# Patient Record
Sex: Male | Born: 1980 | Race: White | Hispanic: No | Marital: Married | Smoking: Former smoker
Health system: Southern US, Community
[De-identification: ages and names within clinical notes are randomized; demographics above are authoritative.]

## PROBLEM LIST (undated history)

## (undated) HISTORY — PX: OTHER SURGICAL HISTORY: SHX169

## (undated) HISTORY — PX: TONSILLECTOMY: SUR1361

---
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
## 2010-09-26 ENCOUNTER — Observation Stay (HOSPITAL_COMMUNITY): Admission: EM | Admit: 2010-09-26 | Discharge: 2010-09-27 | Payer: Self-pay | Admitting: Emergency Medicine

## 2011-01-12 LAB — CBC
HCT: 53.8 % — ABNORMAL HIGH (ref 39.0–52.0)
Hemoglobin: 18.4 g/dL — ABNORMAL HIGH (ref 13.0–17.0)
MCH: 29.1 pg (ref 26.0–34.0)
MCHC: 34.2 g/dL (ref 30.0–36.0)
MCV: 85 fL (ref 78.0–100.0)
Platelets: 297 10*3/uL (ref 150–400)
RBC: 6.33 MIL/uL — ABNORMAL HIGH (ref 4.22–5.81)
RDW: 12.8 % (ref 11.5–15.5)
WBC: 9.2 10*3/uL (ref 4.0–10.5)

## 2011-01-12 LAB — COMPREHENSIVE METABOLIC PANEL
ALT: 30 U/L (ref 0–53)
AST: 20 U/L (ref 0–37)
Albumin: 4.2 g/dL (ref 3.5–5.2)
Alkaline Phosphatase: 66 U/L (ref 39–117)
BUN: 14 mg/dL (ref 6–23)
CO2: 26 mEq/L (ref 19–32)
Calcium: 9.8 mg/dL (ref 8.4–10.5)
Chloride: 104 mEq/L (ref 96–112)
Creatinine, Ser: 1.13 mg/dL (ref 0.4–1.5)
GFR calc Af Amer: >60 mL/min (ref >60)
GFR calc non Af Amer: >60 mL/min (ref >60)
Glucose, Bld: 125 mg/dL — ABNORMAL HIGH (ref 70–99)
Potassium: 4.9 mEq/L (ref 3.5–5.1)
Sodium: 137 mEq/L (ref 135–145)
Total Bilirubin: 0.8 mg/dL (ref 0.3–1.2)
Total Protein: 8.3 g/dL (ref 6.0–8.3)

## 2011-01-12 LAB — GIARDIA/CRYPTOSPORIDIUM SCREEN(EIA)
Cryptosporidium Screen (EIA): NEGATIVE
Giardia Screen - EIA: NEGATIVE

## 2011-01-12 LAB — URINALYSIS, ROUTINE W REFLEX MICROSCOPIC
Hgb urine dipstick: NEGATIVE
Ketones, ur: 15 mg/dL — AB
Protein, ur: >300 mg/dL — AB
Urobilinogen, UA: 0.2 mg/dL (ref 0.0–1.0)

## 2011-01-12 LAB — STOOL CULTURE

## 2011-01-12 LAB — DIFFERENTIAL
Basophils Absolute: 0 10*3/uL (ref 0.0–0.1)
Basophils Relative: 0 % (ref 0–1)
Eosinophils Absolute: 0.1 10*3/uL (ref 0.0–0.7)
Eosinophils Relative: 1 % (ref 0–5)
Lymphocytes Relative: 14 % (ref 12–46)
Lymphs Abs: 1.3 10*3/uL (ref 0.7–4.0)
Monocytes Absolute: 1.2 10*3/uL — ABNORMAL HIGH (ref 0.1–1.0)
Monocytes Relative: 13 % — ABNORMAL HIGH (ref 3–12)
Neutro Abs: 6.6 10*3/uL (ref 1.7–7.7)
Neutrophils Relative %: 72 % (ref 43–77)

## 2011-10-03 ENCOUNTER — Emergency Department (HOSPITAL_COMMUNITY): Payer: Worker's Compensation

## 2011-10-03 ENCOUNTER — Emergency Department (HOSPITAL_COMMUNITY)
Admission: EM | Admit: 2011-10-03 | Discharge: 2011-10-03 | Disposition: A | Discharge status: Home / Self Care | Payer: Worker's Compensation | Attending: Emergency Medicine | Admitting: Emergency Medicine

## 2011-10-03 ENCOUNTER — Encounter: Payer: Self-pay | Admitting: *Deleted

## 2011-10-03 DIAGNOSIS — J705 Respiratory conditions due to smoke inhalation: Secondary | ICD-10-CM | POA: Insufficient documentation

## 2011-10-03 DIAGNOSIS — Z87891 Personal history of nicotine dependence: Secondary | ICD-10-CM | POA: Insufficient documentation

## 2011-10-03 DIAGNOSIS — Y9269 Other specified industrial and construction area as the place of occurrence of the external cause: Secondary | ICD-10-CM | POA: Insufficient documentation

## 2011-10-03 DIAGNOSIS — J680 Bronchitis and pneumonitis due to chemicals, gases, fumes and vapors: Secondary | ICD-10-CM | POA: Insufficient documentation

## 2011-10-03 DIAGNOSIS — X001XXA Exposure to smoke in uncontrolled fire in building or structure, initial encounter: Secondary | ICD-10-CM | POA: Insufficient documentation

## 2011-10-03 MED ORDER — ALBUTEROL SULFATE HFA 108 (90 BASE) MCG/ACT IN AERS
2.0000 | INHALATION_SPRAY | Freq: Once | RESPIRATORY_TRACT | Status: AC
  Administered 2011-10-03: 2 via RESPIRATORY_TRACT
  Filled 2011-10-03: qty 6.7

## 2011-10-03 MED ORDER — PREDNISONE 20 MG PO TABS
60.0000 mg | ORAL_TABLET | Freq: Once | ORAL | Status: AC
  Administered 2011-10-03: 60 mg via ORAL
  Filled 2011-10-03: qty 3

## 2011-10-03 MED ORDER — PREDNISONE 50 MG PO TABS: ORAL_TABLET | ORAL | Status: AC

## 2011-10-03 NOTE — ED Notes (Signed)
Patient transported to X-ray 

## 2011-10-03 NOTE — ED Provider Notes (Signed)
Evaluation and management procedures were performed by the mid-level provider (PA/NP/CNM) under my supervision/collaboration. I was present and available during the ED course. Sudeep Scheibel Y.   Gavin Pound. Oletta Lamas, MD 10/03/11 2321
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
## 2014-08-29 ENCOUNTER — Encounter (HOSPITAL_COMMUNITY): Payer: Self-pay | Admitting: Emergency Medicine

## 2014-08-29 ENCOUNTER — Emergency Department (HOSPITAL_COMMUNITY)
Admission: EM | Admit: 2014-08-29 | Discharge: 2014-08-30 | Disposition: A | Discharge status: Home / Self Care | Payer: Self-pay | Attending: Emergency Medicine | Admitting: Emergency Medicine

## 2014-08-29 DIAGNOSIS — W132XXA Fall from, out of or through roof, initial encounter: Secondary | ICD-10-CM

## 2014-08-29 DIAGNOSIS — Y9389 Activity, other specified: Secondary | ICD-10-CM | POA: Insufficient documentation

## 2014-08-29 DIAGNOSIS — S93402A Sprain of unspecified ligament of left ankle, initial encounter: Secondary | ICD-10-CM | POA: Insufficient documentation

## 2014-08-29 DIAGNOSIS — Y929 Unspecified place or not applicable: Secondary | ICD-10-CM | POA: Insufficient documentation

## 2014-08-29 DIAGNOSIS — S30811A Abrasion of abdominal wall, initial encounter: Secondary | ICD-10-CM | POA: Insufficient documentation

## 2014-08-29 DIAGNOSIS — N50812 Left testicular pain: Secondary | ICD-10-CM

## 2014-08-29 DIAGNOSIS — S90512A Abrasion, left ankle, initial encounter: Secondary | ICD-10-CM | POA: Insufficient documentation

## 2014-08-29 DIAGNOSIS — S90511A Abrasion, right ankle, initial encounter: Secondary | ICD-10-CM | POA: Insufficient documentation

## 2014-08-29 DIAGNOSIS — S3022XA Contusion of scrotum and testes, initial encounter: Secondary | ICD-10-CM | POA: Insufficient documentation

## 2014-08-29 DIAGNOSIS — Z720 Tobacco use: Secondary | ICD-10-CM | POA: Insufficient documentation

## 2014-08-29 DIAGNOSIS — W1789XA Other fall from one level to another, initial encounter: Secondary | ICD-10-CM | POA: Insufficient documentation

## 2014-08-29 DIAGNOSIS — T07XXXA Unspecified multiple injuries, initial encounter: Secondary | ICD-10-CM

## 2014-08-29 MED ORDER — FENTANYL CITRATE 0.05 MG/ML IJ SOLN
100.0000 ug | Freq: Once | INTRAMUSCULAR | Status: AC
Start: 2014-08-30 — End: 2014-08-29
  Administered 2014-08-29: 100 ug via INTRAVENOUS
  Filled 2014-08-29: qty 2

## 2014-08-29 NOTE — ED Notes (Signed)
Pt states he fell 16' from roof/ pt complaining of left ankle, both knees & left groin pain. Pt denies any neck pain, pt alert & oriented x4

## 2014-08-29 NOTE — ED Provider Notes (Signed)
CSN: [id number]     Arrival date & time 08/29/14  2112 History  This chart was scribed for Evan SeamenJohn L Nimai Burbach, MD by Evan Patton, ED Scribe. This patient was seen in room APA14/APA14 and the patient's care was started at 11:38 PM.     Chief Complaint  Patient presents with  . Fall   HPI HPI Comments: Evan Patton is a 33 y.o. male who presents to the Emergency Department complaining of constant bilateral ankle, bilateral knee, and groin pain after falling off a 16 ft roof 6 hours ago. Pt states pain is worst in groin and left ankle. Pt states that he landed on his feet after the fall. His bilateral knees and ankles began to hurt and the left side of groin swelled immediately. Pt states difficulty sitting due to pressure in groin. Pt denies neck and back pain as associated symptoms.  History reviewed. No pertinent past medical history. Past Surgical History  Procedure Laterality Date  . 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 No pertinent family history. History  Substance Use Topics  . Smoking status: Former Games developermoker  . Smokeless tobacco: Not on file  . Alcohol Use: No    Review of Systems  10 Systems reviewed and all are negative for acute change except as noted in the HPI.   Allergies  Review of patient's allergies indicates no known allergies.  Home Medications   Prior to Admission medications   Not on File   BP 140/65  Pulse 76  Temp(Src) 98.9 F (37.2 C) (Oral)  Resp 20  Ht 6\' 2"  (1.88 m)  Wt 350 lb (158.759 kg)  BMI 44.92 kg/m2  SpO2 99% Physical Exam  General: Well-developed, well-nourished male in no acute distress; appearance consistent with age of record HENT: normocephalic; atraumatic Eyes: pupils equal, round and reactive to light; extraocular muscles intact Neck: supple; no C-spine tenderness Heart: regular rate and rhythm; no murmurs, rubs or gallops Lungs: clear to auscultation bilaterally Abdomen: soft; nondistended; nontender; no masses  or hepatosplenomegaly; bowel sounds present; superficial abrasions Back: no spinal tenderness GU: Tanner 5 male, uncircumcised. Left testicular tenderness; groin pain with straight leg raise at about 45 degrees; ecchymosis to left hemi-scrotum Extremities: No deformity; full range of motion; pulses normal; mild swelling and tenderness over left ankle; tenderness over malleolus; superficial abrasions to lower extremities Neurologic: Awake, alert and oriented; motor function intact in all extremities and symmetric; no facial droop Skin: Warm and dry Psychiatric: Normal mood and affect   ED Course  Procedures (including critical care time)  DIAGNOSTIC STUDIES: Oxygen Saturation is 99% on RA, normal by my interpretation.    COORDINATION OF CARE: 11:45 PM Discussed treatment plan with pt at bedside and pt agreed to plan.  MDM  Nursing notes and vitals signs, including pulse oximetry, reviewed.  Summary of this visit's results, reviewed by myself:  Labs:  No results found for this or any previous visit (from the past 24 hour(s)).  Imaging Studies: Dg Hip Complete Left  08/30/2014   CLINICAL DATA:  Larey SeatFell off roof today, ankle and hip pain.  EXAM: LEFT HIP - COMPLETE 2+ VIEW  COMPARISON:  None.  FINDINGS: Femoral heads are well formed and located. Crescentic corticated lucency through posterior acetabulum bilaterally. Hip joint spaces are intact. Sacroiliac joints are symmetric.  No destructive bony lesions. Included soft tissue planes are non-suspicious.  IMPRESSION: Bilateral os acetabulum without convincing evidence of acute fracture deformity or dislocation.  Electronically Signed   By: Evan Metroourtnay  Patton   On: 08/30/2014 01:06   Dg Ankle Complete Left  08/30/2014   CLINICAL DATA:  Larey SeatFell off roof today, ankle and hip pain.  EXAM: LEFT ANKLE COMPLETE - 3+ VIEW  COMPARISON:  None.  FINDINGS: No fracture deformity nor dislocation. The ankle mortise appears congruent and the tibiofibular  syndesmosis intact. No destructive bony lesions. Soft tissue planes are non-suspicious.  IMPRESSION: Negative.   Electronically Signed   By: Evan Metroourtnay  Patton   On: 08/30/2014 01:04   [place] Scrotum  08/30/2014   CLINICAL DATA:  Left testicle pain after falling from a roof on 08/29/2014.  EXAM: SCROTAL ULTRASOUND  DOPPLER ULTRASOUND OF THE TESTICLES  TECHNIQUE: Complete ultrasound examination of the testicles, epididymis, and other scrotal structures was performed. Color and spectral Doppler ultrasound were also utilized to evaluate blood flow to the testicles.  COMPARISON:  None.  FINDINGS: Right testicle  Measurements: 5.3 x 2.7 x 3.8 cm. No mass or microlithiasis visualized.  Left testicle  Measurements: 4.2 x 2.2 x 4.3 cm. No mass or microlithiasis visualized.  Right epididymis:  Normal in size and appearance.  Left epididymis:  Normal in size and appearance.  Hydrocele:  Small right hydrocele.  Varicocele:  Left varicocele.  Pulsed Doppler interrogation of both testes demonstrates low resistance arterial and venous waveforms bilaterally.  IMPRESSION: No acute abnormalities.  Left varicocele.  Small right hydrocele.   Electronically Signed   By: Geanie CooleyJim  Patton M.D.   On: 08/30/2014 01:36   [place] Art/ven Flow Abd Pelv Doppler  08/30/2014   CLINICAL DATA:  Left testicle pain after falling from a roof on 08/29/2014.  EXAM: SCROTAL ULTRASOUND  DOPPLER ULTRASOUND OF THE TESTICLES  TECHNIQUE: Complete ultrasound examination of the testicles, epididymis, and other scrotal structures was performed. Color and spectral Doppler ultrasound were also utilized to evaluate blood flow to the testicles.  COMPARISON:  None.  FINDINGS: Right testicle  Measurements: 5.3 x 2.7 x 3.8 cm. No mass or microlithiasis visualized.  Left testicle  Measurements: 4.2 x 2.2 x 4.3 cm. No mass or microlithiasis visualized.  Right epididymis:  Normal in size and appearance.  Left epididymis:  Normal in size and appearance.  Hydrocele:  Small right  hydrocele.  Varicocele:  Left varicocele.  Pulsed Doppler interrogation of both testes demonstrates low resistance arterial and venous waveforms bilaterally.  IMPRESSION: No acute abnormalities.  Left varicocele.  Small right hydrocele.   Electronically Signed   By: Geanie CooleyJim  Patton M.D.   On: 08/30/2014 01:36   1:44 AM Patient advised of findings. Will refer to urology for further evaluation.  I personally performed the services described in this documentation, which was scribed in my presence. The recorded information has been reviewed and is accurate.   Evan SeamenJohn L Ahnesty Finfrock, MD 08/30/14 [phone]

## 2014-08-29 NOTE — ED Notes (Signed)
Patient states he fell off of a roof at 1800. Patient states he slid off on his stomach, and landed on his feet. Patient states abrasion to abdomin, pain to left ankle, groin pain and testicular pain to the left. Bilateral knee pain. Patient denies back/neck pain A&OX4.

## 2014-08-30 ENCOUNTER — Emergency Department (HOSPITAL_COMMUNITY): Payer: Self-pay

## 2014-08-30 MED ORDER — FENTANYL CITRATE 0.05 MG/ML IJ SOLN
100.0000 ug | Freq: Once | INTRAMUSCULAR | Status: AC
  Administered 2014-08-30: 100 ug via INTRAVENOUS
  Filled 2014-08-30: qty 2

## 2014-08-30 MED ORDER — HYDROCODONE-ACETAMINOPHEN 5-325 MG PO TABS: 1.0000 | ORAL_TABLET | Freq: Four times a day (QID) | ORAL | Status: DC | PRN

## 2014-08-30 NOTE — ED Notes (Signed)
No Bariatric crutches available.

## 2014-08-30 NOTE — ED Notes (Signed)
Pt alert & oriented x4. Patient given discharge instructions, paperwork & prescription(s). Patient verbalized understanding. Pt left department by wheelchair w/ no further questions. 

## 2014-08-31 ENCOUNTER — Other Ambulatory Visit (HOSPITAL_COMMUNITY): Payer: Worker's Compensation

## 2014-09-02 ENCOUNTER — Emergency Department (HOSPITAL_COMMUNITY): Payer: Self-pay

## 2014-09-02 ENCOUNTER — Encounter (HOSPITAL_COMMUNITY): Payer: Self-pay | Admitting: *Deleted

## 2014-09-02 ENCOUNTER — Emergency Department (HOSPITAL_COMMUNITY)
Admission: EM | Admit: 2014-09-02 | Discharge: 2014-09-03 | Disposition: A | Discharge status: Home / Self Care | Payer: Self-pay | Attending: Emergency Medicine | Admitting: Emergency Medicine

## 2014-09-02 DIAGNOSIS — R300 Dysuria: Secondary | ICD-10-CM | POA: Insufficient documentation

## 2014-09-02 DIAGNOSIS — W132XXA Fall from, out of or through roof, initial encounter: Secondary | ICD-10-CM | POA: Insufficient documentation

## 2014-09-02 DIAGNOSIS — S300XXD Contusion of lower back and pelvis, subsequent encounter: Secondary | ICD-10-CM

## 2014-09-02 DIAGNOSIS — Y9289 Other specified places as the place of occurrence of the external cause: Secondary | ICD-10-CM | POA: Insufficient documentation

## 2014-09-02 DIAGNOSIS — S300XXA Contusion of lower back and pelvis, initial encounter: Secondary | ICD-10-CM | POA: Insufficient documentation

## 2014-09-02 DIAGNOSIS — Z87891 Personal history of nicotine dependence: Secondary | ICD-10-CM | POA: Insufficient documentation

## 2014-09-02 DIAGNOSIS — S3022XA Contusion of scrotum and testes, initial encounter: Secondary | ICD-10-CM | POA: Insufficient documentation

## 2014-09-02 DIAGNOSIS — W19XXXA Unspecified fall, initial encounter: Secondary | ICD-10-CM

## 2014-09-02 DIAGNOSIS — Y9389 Activity, other specified: Secondary | ICD-10-CM | POA: Insufficient documentation

## 2014-09-02 LAB — COMPREHENSIVE METABOLIC PANEL
ALBUMIN: 3.5 g/dL (ref 3.5–5.2)
ALK PHOS: 58 U/L (ref 39–117)
ALT: 49 U/L (ref 0–53)
ANION GAP: 8 (ref 5–15)
AST: 33 U/L (ref 0–37)
BUN: 8 mg/dL (ref 6–23)
CO2: 29 mEq/L (ref 19–32)
Calcium: 8.8 mg/dL (ref 8.4–10.5)
Chloride: 105 mEq/L (ref 96–112)
Creatinine, Ser: 0.8 mg/dL (ref 0.50–1.35)
GFR calc Af Amer: >90 mL/min (ref >90)
GFR calc non Af Amer: >90 mL/min (ref >90)
Glucose, Bld: 92 mg/dL (ref 70–99)
POTASSIUM: 4.1 meq/L (ref 3.7–5.3)
SODIUM: 142 meq/L (ref 137–147)
TOTAL PROTEIN: 6.7 g/dL (ref 6.0–8.3)
Total Bilirubin: 0.4 mg/dL (ref 0.3–1.2)

## 2014-09-02 LAB — URINALYSIS, ROUTINE W REFLEX MICROSCOPIC
BILIRUBIN URINE: NEGATIVE
Glucose, UA: NEGATIVE mg/dL
Hgb urine dipstick: NEGATIVE
Ketones, ur: NEGATIVE mg/dL
LEUKOCYTES UA: NEGATIVE
NITRITE: NEGATIVE
PH: 6.5 (ref 5.0–8.0)
Protein, ur: NEGATIVE mg/dL
SPECIFIC GRAVITY, URINE: 1.01 (ref 1.005–1.030)
Urobilinogen, UA: 0.2 mg/dL (ref 0.0–1.0)

## 2014-09-02 LAB — CBC WITH DIFFERENTIAL/PLATELET
BASOS ABS: 0 10*3/uL (ref 0.0–0.1)
BASOS PCT: 1 % (ref 0–1)
EOS ABS: 0.2 10*3/uL (ref 0.0–0.7)
Eosinophils Relative: 4 % (ref 0–5)
HCT: 38.1 % — ABNORMAL LOW (ref 39.0–52.0)
Hemoglobin: 13.1 g/dL (ref 13.0–17.0)
Lymphocytes Relative: 26 % (ref 12–46)
Lymphs Abs: 1.3 10*3/uL (ref 0.7–4.0)
MCH: 28.9 pg (ref 26.0–34.0)
MCHC: 34.4 g/dL (ref 30.0–36.0)
MCV: 83.9 fL (ref 78.0–100.0)
MONOS PCT: 10 % (ref 3–12)
Monocytes Absolute: 0.5 10*3/uL (ref 0.1–1.0)
NEUTROS PCT: 59 % (ref 43–77)
Neutro Abs: 2.9 10*3/uL (ref 1.7–7.7)
PLATELETS: 195 10*3/uL (ref 150–400)
RBC: 4.54 MIL/uL (ref 4.22–5.81)
RDW: 12.8 % (ref 11.5–15.5)
WBC: 4.8 10*3/uL (ref 4.0–10.5)

## 2014-09-02 MED ORDER — ONDANSETRON HCL 4 MG/2ML IJ SOLN
4.0000 mg | Freq: Once | INTRAMUSCULAR | Status: AC
  Administered 2014-09-02: 4 mg via INTRAVENOUS
  Filled 2014-09-02: qty 2

## 2014-09-02 MED ORDER — OXYCODONE-ACETAMINOPHEN 5-325 MG PO TABS: 2.0000 | ORAL_TABLET | ORAL | Status: DC | PRN

## 2014-09-02 MED ORDER — SODIUM CHLORIDE 0.9 % IV SOLN
INTRAVENOUS | Status: DC
  Administered 2014-09-02: 22:00:00 via INTRAVENOUS

## 2014-09-02 MED ORDER — SODIUM CHLORIDE 0.9 % IV BOLUS (SEPSIS)
500.0000 mL | Freq: Once | INTRAVENOUS | Status: AC
Start: 2014-09-02 — End: 2014-09-02
  Administered 2014-09-02: 500 mL via INTRAVENOUS

## 2014-09-02 MED ORDER — HYDROMORPHONE HCL 1 MG/ML IJ SOLN
1.0000 mg | Freq: Once | INTRAMUSCULAR | Status: AC
  Administered 2014-09-02: 1 mg via INTRAVENOUS
  Filled 2014-09-02: qty 1

## 2014-09-02 MED ORDER — IOHEXOL 300 MG/ML  SOLN
120.0000 mL | Freq: Once | INTRAMUSCULAR | Status: AC | PRN
  Administered 2014-09-02: 120 mL via INTRAVENOUS

## 2014-09-02 NOTE — ED Notes (Signed)
Pt reports sudden appearance of dark purple bruise today on suprapubic area. Reports falling from roof on Friday and was seen here afterwards with no problems discovered. Pt continued to have pain in pelvic area and pain when bending over. Reports there was no bruise last night but it came up this morning. States he started having dysuria but no hematuria.

## 2014-09-02 NOTE — Discharge Instructions (Signed)
Contusion °A contusion is a deep bruise. Contusions happen when an injury causes bleeding under the skin. Signs of bruising include pain, puffiness (swelling), and discolored skin. The contusion may turn blue, purple, or yellow. °HOME CARE  °· Put ice on the injured area. °¨ Put ice in a plastic bag. °¨ Place a towel between your skin and the bag. °¨ Leave the ice on for 15-20 minutes, 03-04 times a day. °· Only take medicine as told by your doctor. °· Rest the injured area. °· If possible, raise (elevate) the injured area to lessen puffiness. °GET HELP RIGHT AWAY IF:  °· You have more bruising or puffiness. °· You have pain that is getting worse. °· Your puffiness or pain is not helped by medicine. °MAKE SURE YOU:  °· Understand these instructions. °· Will watch your condition. °· Will get help right away if you are not doing well or get worse. °Document Released: 04/04/2008 Document Revised: 01/09/2012 Document Reviewed: 08/22/2011 °Exit[employer]® Patient Information ©2015 [employer]. This information is not intended to replace advice given to you by your health care provider. Make sure you discuss any questions you have with your health care provider. ° °

## 2014-09-02 NOTE — ED Notes (Signed)
Pt fell off roof Friday, has been seen that day but large bruise noted last night to pelvic area suddenly, pain with urination

## 2014-09-02 NOTE — ED Provider Notes (Signed)
CSN: [id number]     Arrival date & time 09/02/14  1649 History   This chart was scribe for No att. providers found by Angelene GiovanniEmmanuella Mensah, ED Scribe. The patient was seen in room APA01/APA01 and the patient's care was started at 8:59 PM.    Chief Complaint  Patient presents with  . Pelvic Pain    The history is provided by the patient. No language interpreter was used.   HPI Comments: Evan Patton is a 33 y.o. male who presents to the Emergency Department complaining of an acute onset of an ecchymosis on his suprapubic onset last night. He states that he fell off a roof 4 days ago and landed on feet. However, last night there was a sharp pain in left side of groin (scrotum) radiating to left hip. At 1 am, there was a "burst" of pain and swelling to the area. He states that he has is associated dysuria, pain in his inner side of his thighs and it hurts to sit up. He reports that he took Vicodin at 3 am this morning and has only taken aleve after that PTA. He is able to ambulate and drive. He states that on Friday he had blood in his stool but has since resolved. He denies N/V. He denies any other medical problems or allergy to medication.    History reviewed. No pertinent past medical history. Past Surgical History  Procedure Laterality Date  . 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 No pertinent family history. History  Substance Use Topics  . Smoking status: Former Games developermoker  . Smokeless tobacco: Not on file  . Alcohol Use: No    Review of Systems  Constitutional: Negative for fever.  Gastrointestinal: Negative for nausea and vomiting.  Genitourinary: Positive for dysuria.      Allergies  Review of patient's allergies indicates no known allergies.  Home Medications   Prior to Admission medications   Medication Sig Start Date End Date Taking? Authorizing Provider  oxyCODONE-acetaminophen (PERCOCET) 5-325 MG per tablet Take 2 tablets by mouth every 4 (four) hours as  needed. 09/02/14   Flint MelterElliott L Cirilo Canner, MD   BP 137/62 mmHg  Pulse 79  Temp(Src) 98.5 F (36.9 C)  Resp 20  Ht 6\' 2"  (1.88 m)  Wt 355 lb (161.027 kg)  BMI 45.56 kg/m2  SpO2 97% Physical Exam  Constitutional: He is oriented to person, place, and time. He appears well-developed and well-nourished.  HENT:  Head: Normocephalic and atraumatic.  Right Ear: External ear normal.  Left Ear: External ear normal.  Eyes: Conjunctivae and EOM are normal. Pupils are equal, round, and reactive to light.  Neck: Normal range of motion and phonation normal. Neck supple.  Cardiovascular: Normal rate, regular rhythm and normal heart sounds.   Pulmonary/Chest: Effort normal and breath sounds normal. He exhibits no bony tenderness.  Abdominal: Soft. There is no tenderness.  Mons bilateral (9 by 5cm). Flat ecchymosis  Genitourinary: Penis normal.  Normal palpation to testicle TTP   Musculoskeletal: Normal range of motion.  Neurological: He is alert and oriented to person, place, and time. No cranial nerve deficit or sensory deficit. He exhibits normal muscle tone. Coordination normal.  Skin: Skin is warm, dry and intact.  Psychiatric: He has a normal mood and affect. His behavior is normal. Judgment and thought content normal.  Nursing note and vitals reviewed.   ED Course  Procedures (including critical care time) Medications  0.9 %  sodium chloride  infusion ( Intravenous Stopped 09/02/14 2354)  sodium chloride 0.9 % bolus 500 mL (0 mLs Intravenous Stopped 09/02/14 2209)  iohexol (OMNIPAQUE) 300 MG/ML solution 120 mL (120 mLs Intravenous Contrast Given 09/02/14 2132)  HYDROmorphone (DILAUDID) injection 1 mg (1 mg Intravenous Given 09/02/14 2209)  ondansetron (ZOFRAN) injection 4 mg (4 mg Intravenous Given 09/02/14 2209)   Patient Vitals for the past 24 hrs:  BP Temp Pulse Resp SpO2 Height Weight  09/02/14 2125 137/62 mmHg - 79 20 97 % - -  09/02/14 1703 132/69 mmHg - 89 20 - - -  09/02/14 1659 - 98.5  F (36.9 C) - - 100 % 6\' 2"  (1.88 m) (!) 355 lb (161.027 kg)   9:07 PM- Pt advised of plan for treatment and pt agrees.  Labs Review Labs Reviewed  CBC WITH DIFFERENTIAL - Abnormal; Notable for the following:    HCT 38.1 (*)    All other components within normal limits  COMPREHENSIVE METABOLIC PANEL  URINALYSIS, ROUTINE W REFLEX MICROSCOPIC    Imaging Review Ct Abdomen Pelvis W Contrast  09/02/2014   CLINICAL DATA:  Trauma. Fell from roof on 08/29/2014. Bruising to the suprapubic and pelvic area. Pain in the left groin and left leg. Painful urination. Seen on 10/31 for same injury.  EXAM: CT ABDOMEN AND PELVIS WITH CONTRAST  TECHNIQUE: Multidetector CT imaging of the abdomen and pelvis was performed using the standard protocol following bolus administration of intravenous contrast.  CONTRAST:  120mL OMNIPAQUE IOHEXOL 300 MG/ML  SOLN  COMPARISON:  None.  FINDINGS: Lung bases are clear.  The liver, spleen, gallbladder, pancreas, adrenal glands, kidneys, abdominal aorta, inferior vena cava, and retroperitoneal lymph nodes are unremarkable. Stomach and small bowel are decompressed. Stool-filled colon without distention. No free air or free fluid in the abdomen. Abdominal wall musculature appears intact.  Pelvis: Prostate gland is not enlarged. Bladder wall is not thickened. No pelvic mass or lymphadenopathy. No free or loculated pelvic fluid collections. Appendix is normal. No evidence of diverticulitis.  Mild degenerative changes in the lumbar spine with endplate hypertrophic changes. Pelvis, sacrum, and hips appear intact.  IMPRESSION: No acute abnormalities demonstrated in the abdomen or pelvis.   Electronically Signed   By: Burman NievesWilliam  Stevens M.D.   On: 09/02/2014 22:16     EKG Interpretation None      MDM   Final diagnoses:  Fall  Contusion of pelvis, subsequent encounter  Scrotal hematoma    Nonspecific ecchymosis, without findings for visceral injury, fractures, vascular injury, or  secondary infection.  Nursing Notes Reviewed/ Care Coordinated Applicable Imaging Reviewed Interpretation of Laboratory Data incorporated into ED treatment  The patient appears reasonably screened and/or stabilized for discharge and I doubt any other medical condition or other [facility] requiring further screening, evaluation, or treatment in the ED at this time prior to discharge.  Plan: Home Medications- Percocet; Home Treatments- rest; return here if the recommended treatment, does not improve the symptoms; Recommended follow up- PCP of choice as needed     I personally performed the services described in this documentation, which was scribed in my presence. The recorded information has been reviewed and is accurate.    Flint MelterElliott L Lanelle Lindo, MD 09/03/14 [phone]

## 2014-09-02 NOTE — ED Notes (Signed)
MD at bedside. 

## 2016-02-22 ENCOUNTER — Ambulatory Visit (INDEPENDENT_AMBULATORY_CARE_PROVIDER_SITE_OTHER): Payer: Worker's Compensation

## 2016-02-22 ENCOUNTER — Encounter (HOSPITAL_COMMUNITY): Payer: Self-pay

## 2016-02-22 ENCOUNTER — Ambulatory Visit (INDEPENDENT_AMBULATORY_CARE_PROVIDER_SITE_OTHER): Payer: Worker's Compensation | Admitting: Family

## 2016-02-22 ENCOUNTER — Emergency Department (HOSPITAL_COMMUNITY)
Admission: EM | Admit: 2016-02-22 | Discharge: 2016-02-22 | Disposition: A | Discharge status: Home / Self Care | Payer: Worker's Compensation | Attending: Emergency Medicine | Admitting: Emergency Medicine

## 2016-02-22 ENCOUNTER — Encounter: Payer: Self-pay | Admitting: Family

## 2016-02-22 VITALS — Temp 97.6°F | Ht 74.0 in | Wt >= 6400 oz

## 2016-02-22 DIAGNOSIS — S60932A Unspecified superficial injury of left thumb, initial encounter: Secondary | ICD-10-CM

## 2016-02-22 DIAGNOSIS — Y990 Civilian activity done for income or pay: Secondary | ICD-10-CM | POA: Diagnosis not present

## 2016-02-22 DIAGNOSIS — S61032A Puncture wound without foreign body of left thumb without damage to nail, initial encounter: Secondary | ICD-10-CM | POA: Diagnosis not present

## 2016-02-22 DIAGNOSIS — S60552A Superficial foreign body of left hand, initial encounter: Secondary | ICD-10-CM

## 2016-02-22 DIAGNOSIS — T148XXA Other injury of unspecified body region, initial encounter: Secondary | ICD-10-CM

## 2016-02-22 DIAGNOSIS — Y9389 Activity, other specified: Secondary | ICD-10-CM | POA: Insufficient documentation

## 2016-02-22 DIAGNOSIS — Y929 Unspecified place or not applicable: Secondary | ICD-10-CM | POA: Diagnosis not present

## 2016-02-22 DIAGNOSIS — W228XXA Striking against or struck by other objects, initial encounter: Secondary | ICD-10-CM | POA: Diagnosis not present

## 2016-02-22 DIAGNOSIS — Z87891 Personal history of nicotine dependence: Secondary | ICD-10-CM | POA: Insufficient documentation

## 2016-02-22 DIAGNOSIS — S60352A Superficial foreign body of left thumb, initial encounter: Secondary | ICD-10-CM | POA: Diagnosis not present

## 2016-02-22 DIAGNOSIS — S6992XA Unspecified injury of left wrist, hand and finger(s), initial encounter: Secondary | ICD-10-CM | POA: Diagnosis present

## 2016-02-22 MED ORDER — ONDANSETRON 4 MG PO TBDP
4.0000 mg | ORAL_TABLET | Freq: Once | ORAL | Status: AC
  Administered 2016-02-22: 4 mg via ORAL
  Filled 2016-02-22: qty 1

## 2016-02-22 MED ORDER — HYDROCODONE-ACETAMINOPHEN 5-325 MG PO TABS: 2.0000 | ORAL_TABLET | ORAL | Status: DC | PRN

## 2016-02-22 MED ORDER — DOXYCYCLINE HYCLATE 100 MG PO CAPS: 100.0000 mg | ORAL_CAPSULE | Freq: Two times a day (BID) | ORAL | Status: DC

## 2016-02-22 MED ORDER — DOXYCYCLINE HYCLATE 100 MG PO TABS
100.0000 mg | ORAL_TABLET | Freq: Once | ORAL | Status: AC
  Administered 2016-02-22: 100 mg via ORAL
  Filled 2016-02-22: qty 1

## 2016-02-22 MED ORDER — CEPHALEXIN 500 MG PO CAPS
500.0000 mg | ORAL_CAPSULE | Freq: Three times a day (TID) | ORAL | Status: DC
Start: 2016-02-22 — End: 2016-02-24
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
## 2016-02-22 NOTE — Patient Instructions (Signed)
Puncture Wound A puncture wound is an injury that is caused by a sharp, thin object that goes through (penetrates) your skin. Usually, a puncture wound does not leave a large opening in your skin, so it may not bleed a lot. However, when you get a puncture wound, dirt or other materials (foreign bodies) can be forced into your wound and break off inside. This increases the chance of infection, such as tetanus. CAUSES Puncture wounds are caused by any sharp, thin object that goes through your skin, such as:  Animal teeth, as with an animal bite.  Sharp, pointed objects, such as nails, splinters of glass, fishhooks, and needles. SYMPTOMS Symptoms of a puncture wound include:  Pain.  Bleeding.  Swelling.  Bruising.  Fluid leaking from the wound.  Numbness, tingling, or loss of function. DIAGNOSIS This condition is diagnosed with a medical history and physical exam. Your wound will be checked to see if it contains any foreign bodies. You may also have X-rays or other imaging tests. TREATMENT Treatment for a puncture wound depends on how serious the wound is. It also depends on whether the wound contains any foreign bodies. Treatment for all types of puncture wounds usually starts with:  Controlling the bleeding.  Washing out the wound with a germ-free (sterile) salt-water solution.  Checking the wound for foreign bodies. Treatment may also include:  Having the wound opened surgically to remove a foreign object.  Closing the wound with stitches (sutures) if it continues to bleed.  Covering the wound with antibiotic ointments and a bandage (dressing).  Receiving a tetanus shot.  Receiving a rabies vaccine. HOME CARE INSTRUCTIONS Medicines  Take or apply over-the-counter and prescription medicines only as told by your health care provider.  If you were prescribed an antibiotic, take or apply it as told by your health care provider. Do not stop using the antibiotic even if  your condition improves. Wound Care  There are many ways to close and cover a wound. For example, a wound can be covered with sutures, skin glue, or adhesive strips. Follow instructions from your health care provider about:  How to take care of your wound.  When and how you should change your dressing.  When you should remove your dressing.  Removing whatever was used to close your wound.  Keep the dressing dry as told by your health care provider. Do not take baths, swim, use a hot tub, or do anything that would put your wound underwater until your health care provider approves.  Clean the wound as told by your health care provider.  Do not scratch or pick at the wound.  Check your wound every day for signs of infection. Watch for:  Redness, swelling, or pain.  Fluid, blood, or pus. General Instructions  Raise (elevate) the injured area above the level of your heart while you are sitting or lying down.  If your puncture wound is in your foot, ask your health care provider if you need to avoid putting weight on your foot and for how long.  Keep all follow-up visits as told by your health care provider. This is important. SEEK MEDICAL CARE IF:  You received a tetanus shot and you have swelling, severe pain, redness, or bleeding at the injection site.  You have a fever.  Your sutures come out.  You notice a bad smell coming from your wound or your dressing.  You notice something coming out of your wound, such as wood or glass.  Your   pain is not controlled with medicine.  You have increased redness, swelling, or pain at the site of your wound.  You have fluid, blood, or pus coming from your wound.  You notice a change in the color of your skin near your wound.  You need to change the dressing frequently due to fluid, blood, or pus draining from your wound.  You develop a new rash.  You develop numbness around your wound. SEEK IMMEDIATE MEDICAL CARE IF:  You  develop severe swelling around your wound.  Your pain suddenly increases and is severe.  You develop painful skin lumps.  You have a red streak going away from your wound.  The wound is on your hand or foot and you cannot properly move a finger or toe.  The wound is on your hand or foot and you notice that your fingers or toes look pale or bluish.   This information is not intended to replace advice given to you by your health care provider. Make sure you discuss any questions you have with your health care provider.   Document Released: 07/27/2005 Document Revised: 07/08/2015 Document Reviewed: 12/10/2014 Elsevier Interactive Patient Education 2016 [employer]  
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
## 2016-02-22 NOTE — ED Notes (Signed)
Patient soaking hand in basin of sterile water, hydrogen peroxide and betadine.

## 2016-02-22 NOTE — Discharge Instructions (Signed)
Please call Dr.Kuzma's office tomorrow morning. They will give you an appointment time to see him in the office. Please keep your hand elevated as much as possible. Keep your dressing on clean and dry. Use doxycycline 2 times daily with food. Please do not use the Keflex at this time. You may continue to use the hydrocodone as prescribed. This medication may cause drowsiness, please use with caution.

## 2016-02-22 NOTE — ED Notes (Signed)
To-Go pack of Norco given to patient with verbal instructions. Patient verbalizes understanding. Robin- EMT to witness.

## 2016-02-22 NOTE — ED Notes (Signed)
Patient states he has metal in his left hand from an injury at work. Patient was seen and [facility] and has had XRAY PTA

## 2016-02-22 NOTE — ED Notes (Signed)
Cleaned and dried hand.  Applied telfa pad, kling and ace bandage.

## 2016-02-23 ENCOUNTER — Encounter: Payer: Self-pay | Admitting: Family

## 2016-02-23 ENCOUNTER — Telehealth: Payer: Self-pay | Admitting: Family

## 2016-02-23 MED ORDER — HYDROCODONE-ACETAMINOPHEN 5-325 MG PO TABS: 1.0000 | ORAL_TABLET | ORAL | Status: DC | PRN

## 2016-02-23 NOTE — Telephone Encounter (Signed)
Pt aware.

## 2016-02-23 NOTE — Telephone Encounter (Signed)
RX ready for pick up 

## 2016-02-24 ENCOUNTER — Encounter (HOSPITAL_BASED_OUTPATIENT_CLINIC_OR_DEPARTMENT_OTHER): Payer: Self-pay | Admitting: *Deleted

## 2016-02-24 ENCOUNTER — Other Ambulatory Visit: Payer: Self-pay | Admitting: Orthopedic Surgery

## 2016-02-24 NOTE — Progress Notes (Signed)
Spoke with pt's wife for pre-op call, pt with her but was driving. Denies cardiac history, chest pain or sob.

## 2016-02-24 NOTE — Progress Notes (Signed)
Evan Patton at Dr Merrilee SeashoreKuzma's office notified that pt's BMI is 52.75 and cannot be done at [facility].

## 2016-02-25 ENCOUNTER — Ambulatory Visit (HOSPITAL_COMMUNITY): Payer: Worker's Compensation | Admitting: Anesthesiology

## 2016-02-25 ENCOUNTER — Encounter (HOSPITAL_COMMUNITY): Payer: Self-pay | Admitting: *Deleted

## 2016-02-25 ENCOUNTER — Encounter (HOSPITAL_COMMUNITY)
Admission: RE | Disposition: A | Discharge status: Home / Self Care | Payer: Self-pay | Source: Ambulatory Visit | Attending: Orthopedic Surgery

## 2016-02-25 ENCOUNTER — Ambulatory Visit (HOSPITAL_BASED_OUTPATIENT_CLINIC_OR_DEPARTMENT_OTHER)
Admission: RE | Admit: 2016-02-25 | Discharge: 2016-02-25 | Disposition: A | Discharge status: Home / Self Care | Payer: Worker's Compensation | Source: Ambulatory Visit | Attending: Orthopedic Surgery | Admitting: Orthopedic Surgery

## 2016-02-25 DIAGNOSIS — Y990 Civilian activity done for income or pay: Secondary | ICD-10-CM | POA: Diagnosis not present

## 2016-02-25 DIAGNOSIS — F17210 Nicotine dependence, cigarettes, uncomplicated: Secondary | ICD-10-CM | POA: Insufficient documentation

## 2016-02-25 DIAGNOSIS — Z6841 Body Mass Index (BMI) 40.0 and over, adult: Secondary | ICD-10-CM | POA: Insufficient documentation

## 2016-02-25 DIAGNOSIS — S61442A Puncture wound with foreign body of left hand, initial encounter: Secondary | ICD-10-CM | POA: Diagnosis present

## 2016-02-25 DIAGNOSIS — W208XXA Other cause of strike by thrown, projected or falling object, initial encounter: Secondary | ICD-10-CM | POA: Diagnosis not present

## 2016-02-25 HISTORY — PX: FOREIGN BODY REMOVAL: SHX962

## 2016-02-25 HISTORY — DX: Morbid (severe) obesity due to excess calories: E66.01

## 2016-02-25 LAB — HEMOGLOBIN: HEMOGLOBIN: 13.3 g/dL (ref 13.0–17.0)

## 2016-02-25 SURGERY — FOREIGN BODY REMOVAL ADULT
Anesthesia: General | Site: Hand | Laterality: Left

## 2016-02-25 MED ORDER — FENTANYL CITRATE (PF) 250 MCG/5ML IJ SOLN
INTRAMUSCULAR | Status: AC
  Filled 2016-02-25: qty 5

## 2016-02-25 MED ORDER — BUPIVACAINE HCL (PF) 0.25 % IJ SOLN
INTRAMUSCULAR | Status: AC
  Filled 2016-02-25: qty 30

## 2016-02-25 MED ORDER — PHENYLEPHRINE 40 MCG/ML (10ML) SYRINGE FOR IV PUSH (FOR BLOOD PRESSURE SUPPORT)
PREFILLED_SYRINGE | INTRAVENOUS | Status: AC
  Filled 2016-02-25: qty 10

## 2016-02-25 MED ORDER — CHLORHEXIDINE GLUCONATE 4 % EX LIQD: 60.0000 mL | Freq: Once | CUTANEOUS | Status: DC

## 2016-02-25 MED ORDER — ONDANSETRON HCL 4 MG/2ML IJ SOLN
INTRAMUSCULAR | Status: DC | PRN
  Administered 2016-02-25: 4 mg via INTRAVENOUS

## 2016-02-25 MED ORDER — HYDROCODONE-ACETAMINOPHEN 10-325 MG PO TABS: 1.0000 | ORAL_TABLET | Freq: Four times a day (QID) | ORAL | Status: DC | PRN

## 2016-02-25 MED ORDER — LACTATED RINGERS IV SOLN
INTRAVENOUS | Status: DC
  Administered 2016-02-25 (×2): via INTRAVENOUS

## 2016-02-25 MED ORDER — DEXTROSE 5 % IV SOLN
3.0000 g | INTRAVENOUS | Status: AC
  Administered 2016-02-25: 3 g via INTRAVENOUS
  Filled 2016-02-25: qty 3000

## 2016-02-25 MED ORDER — MIDAZOLAM HCL 5 MG/5ML IJ SOLN
INTRAMUSCULAR | Status: DC | PRN
  Administered 2016-02-25: 2 mg via INTRAVENOUS

## 2016-02-25 MED ORDER — FENTANYL CITRATE (PF) 250 MCG/5ML IJ SOLN
INTRAMUSCULAR | Status: DC | PRN
  Administered 2016-02-25: 100 ug via INTRAVENOUS
  Administered 2016-02-25: 50 ug via INTRAVENOUS

## 2016-02-25 MED ORDER — MIDAZOLAM HCL 2 MG/2ML IJ SOLN
INTRAMUSCULAR | Status: AC
  Filled 2016-02-25: qty 2

## 2016-02-25 MED ORDER — DEXAMETHASONE SODIUM PHOSPHATE 4 MG/ML IJ SOLN
INTRAMUSCULAR | Status: DC | PRN
  Administered 2016-02-25: 5 mg via INTRAVENOUS

## 2016-02-25 MED ORDER — FENTANYL CITRATE (PF) 100 MCG/2ML IJ SOLN: 25.0000 ug | INTRAMUSCULAR | Status: DC | PRN

## 2016-02-25 MED ORDER — SUCCINYLCHOLINE CHLORIDE 20 MG/ML IJ SOLN
INTRAMUSCULAR | Status: DC | PRN
  Administered 2016-02-25: 120 mg via INTRAVENOUS

## 2016-02-25 MED ORDER — SODIUM CHLORIDE 0.9 % IR SOLN
Status: DC | PRN
  Administered 2016-02-25: 1000 mL

## 2016-02-25 MED ORDER — BUPIVACAINE HCL (PF) 0.25 % IJ SOLN
INTRAMUSCULAR | Status: DC | PRN
  Administered 2016-02-25: 7 mL

## 2016-02-25 MED ORDER — LIDOCAINE HCL (CARDIAC) 20 MG/ML IV SOLN
INTRAVENOUS | Status: DC | PRN
  Administered 2016-02-25: 100 mg via INTRAVENOUS

## 2016-02-25 MED ORDER — PROPOFOL 10 MG/ML IV BOLUS
INTRAVENOUS | Status: AC
  Filled 2016-02-25: qty 20

## 2016-02-25 MED ORDER — KETOROLAC TROMETHAMINE 30 MG/ML IJ SOLN: 30.0000 mg | Freq: Once | INTRAMUSCULAR | Status: DC | PRN

## 2016-02-25 MED ORDER — HYDROCODONE-ACETAMINOPHEN 7.5-325 MG PO TABS: 1.0000 | ORAL_TABLET | Freq: Once | ORAL | Status: DC | PRN

## 2016-02-25 MED ORDER — SUCCINYLCHOLINE CHLORIDE 200 MG/10ML IV SOSY
PREFILLED_SYRINGE | INTRAVENOUS | Status: AC
  Filled 2016-02-25: qty 10

## 2016-02-25 MED ORDER — PROPOFOL 10 MG/ML IV BOLUS
INTRAVENOUS | Status: DC | PRN
  Administered 2016-02-25: 200 mg via INTRAVENOUS

## 2016-02-25 SURGICAL SUPPLY — 35 items
BANDAGE COBAN STERILE 2 (GAUZE/BANDAGES/DRESSINGS) ×2 IMPLANT
BANDAGE ELASTIC 4 VELCRO ST LF (GAUZE/BANDAGES/DRESSINGS) ×3 IMPLANT
BNDG CMPR 9X4 STRL LF SNTH (GAUZE/BANDAGES/DRESSINGS) ×1
BNDG CONFORM 2 STRL LF (GAUZE/BANDAGES/DRESSINGS) ×2 IMPLANT
BNDG ESMARK 4X9 LF (GAUZE/BANDAGES/DRESSINGS) ×3 IMPLANT
BNDG GAUZE ELAST 4 BULKY (GAUZE/BANDAGES/DRESSINGS) ×3 IMPLANT
CORDS BIPOLAR (ELECTRODE) ×3 IMPLANT
DRAPE OEC MINIVIEW 54X84 (DRAPES) ×2 IMPLANT
DRSG ADAPTIC 3X8 NADH LF (GAUZE/BANDAGES/DRESSINGS) ×1 IMPLANT
DRSG EMULSION OIL 3X3 NADH (GAUZE/BANDAGES/DRESSINGS) ×1 IMPLANT
DRSG PAD ABDOMINAL 8X10 ST (GAUZE/BANDAGES/DRESSINGS) ×2 IMPLANT
GAUZE SPONGE 4X4 12PLY STRL (GAUZE/BANDAGES/DRESSINGS) ×3 IMPLANT
GAUZE XEROFORM 1X8 LF (GAUZE/BANDAGES/DRESSINGS) ×3 IMPLANT
GOWN STRL REUS W/ TWL LRG LVL3 (GOWN DISPOSABLE) ×1 IMPLANT
GOWN STRL REUS W/TWL LRG LVL3 (GOWN DISPOSABLE) ×3
HANDPIECE INTERPULSE COAX TIP (DISPOSABLE)
KIT BASIN OR (CUSTOM PROCEDURE TRAY) ×3 IMPLANT
KIT ROOM TURNOVER OR (KITS) ×3 IMPLANT
MANIFOLD NEPTUNE II (INSTRUMENTS) ×3 IMPLANT
NS IRRIG 1000ML POUR BTL (IV SOLUTION) ×3 IMPLANT
PACK ORTHO EXTREMITY (CUSTOM PROCEDURE TRAY) ×3 IMPLANT
PAD ARMBOARD 7.5X6 YLW CONV (MISCELLANEOUS) ×6 IMPLANT
SET HNDPC FAN SPRY TIP SCT (DISPOSABLE) IMPLANT
SPONGE LAP 18X18 X RAY DECT (DISPOSABLE) ×3 IMPLANT
SPONGE LAP 4X18 X RAY DECT (DISPOSABLE) ×1 IMPLANT
SWAB COLLECTION DEVICE MRSA (MISCELLANEOUS) IMPLANT
TOWEL OR 17X24 6PK STRL BLUE (TOWEL DISPOSABLE) ×3 IMPLANT
TOWEL OR 17X26 10 PK STRL BLUE (TOWEL DISPOSABLE) ×3 IMPLANT
TUBE ANAEROBIC SPECIMEN COL (MISCELLANEOUS) IMPLANT
TUBE CONNECTING 12'X1/4 (SUCTIONS)
TUBE CONNECTING 12X1/4 (SUCTIONS) ×1 IMPLANT
TUBE FEEDING 5FR 15 INCH (TUBING) IMPLANT
UNDERPAD 30X30 INCONTINENT (UNDERPADS AND DIAPERS) ×3 IMPLANT
WATER STERILE IRR 1000ML POUR (IV SOLUTION) ×1 IMPLANT
YANKAUER SUCT BULB TIP NO VENT (SUCTIONS) ×1 IMPLANT

## 2016-02-25 NOTE — Discharge Instructions (Signed)

## 2016-02-25 NOTE — Anesthesia Postprocedure Evaluation (Signed)
Anesthesia Post Note  Patient: Evan Patton  Procedure(s) Performed: Procedure(s) (LRB): REMOVAL FOREIGN BODY LEFT HAND (Left)  Patient location during evaluation: PACU Anesthesia Type: General Level of consciousness: awake and alert Pain management: pain level controlled Vital Signs Assessment: post-procedure vital signs reviewed and stable Respiratory status: spontaneous breathing, nonlabored ventilation and respiratory function stable Cardiovascular status: blood pressure returned to baseline and stable Postop Assessment: no signs of nausea or vomiting Anesthetic complications: no    Last Vitals:  Filed Vitals:   02/25/16 1810 02/25/16 1830  BP: 137/85   Pulse: 81 81  Temp:    Resp: 16     Last Pain:  Filed Vitals:   02/25/16 1839  PainSc: Asleep                 Tinsley Everman,W. EDMOND

## 2016-02-25 NOTE — Anesthesia Procedure Notes (Signed)
Procedure Name: Intubation Date/Time: 02/25/2016 3:35 PM Performed by: Reine JustFLOWERS, Osha Rane T Pre-anesthesia Checklist: Patient identified, Emergency Drugs available, Suction available, Patient being monitored and Timeout performed Patient Re-evaluated:Patient Re-evaluated prior to inductionOxygen Delivery Method: Circle system utilized and Simple face mask Preoxygenation: Pre-oxygenation with 100% oxygen Intubation Type: IV induction Ventilation: Mask ventilation without difficulty Laryngoscope Size: Miller and 3 Grade View: Grade I Tube type: Oral Tube size: 7.5 mm Number of attempts: 1 Airway Equipment and Method: Patient positioned with wedge pillow and Stylet Placement Confirmation: ETT inserted through vocal cords under direct vision,  positive ETCO2 and breath sounds checked- equal and bilateral Secured at: 23 cm Tube secured with: Tape Dental Injury: Teeth and Oropharynx as per pre-operative assessment

## 2016-02-25 NOTE — Op Note (Signed)
[id number] 
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
## 2016-02-25 NOTE — Op Note (Signed)
Intra-operative fluoroscopic images in the [facility], lateral, and oblique views were taken and evaluated by myself.  Removal of radioopaque foreign body was confirmed. 

## 2016-02-25 NOTE — Progress Notes (Signed)
Patient complains of facial swelling to RN. Patient denies sob, difficulty swallowing, or pain. VSS WDL, RN noticed facial discoloration and slight redness bilaterally from patient's baseline. RN notified Dr. Sampson GoonFitzgerald, MD at the bedside.    Nikki DomNguyen, Lothar Prehn N, RN

## 2016-02-25 NOTE — Brief Op Note (Signed)
02/25/2016  4:18 PM  PATIENT:  Evan Patton  35 y.o. male  PRE-OPERATIVE DIAGNOSIS:  LEFT HAND FOREIGN BODY  POST-OPERATIVE DIAGNOSIS:  same  PROCEDURE:  Removal foreign body left hand  SURGEON:  Surgeon(s) and Role:    * Betha LoaKevin Sakinah Rosamond, MD - Primary  PHYSICIAN ASSISTANT:   ASSISTANTS: none   ANESTHESIA:   general  EBL:     BLOOD ADMINISTERED:none  DRAINS: none   LOCAL MEDICATIONS USED:  MARCAINE     SPECIMEN:  No Specimen  DISPOSITION OF SPECIMEN:  N/A  COUNTS:  YES  TOURNIQUET:   Total Tourniquet Time Documented: Upper Arm (Left) - 15 minutes Total: Upper Arm (Left) - 15 minutes   DICTATION: .Other Dictation: Dictation Number [id number]  PLAN OF CARE: Discharge to home after PACU  PATIENT DISPOSITION:  PACU - hemodynamically stable.

## 2016-02-25 NOTE — Transfer of Care (Signed)
Immediate Anesthesia Transfer of Care Note  Patient: Evan Patton  Procedure(s) Performed: Procedure(s) with comments: REMOVAL FOREIGN BODY LEFT HAND (Left) - ANESTHESIA:  BIER BLOCK  Patient Location: PACU  Anesthesia Type:General  Level of Consciousness: awake and alert   Airway & Oxygen Therapy: Patient Spontanous Breathing and Patient connected to face mask oxygen  Post-op Assessment: Report given to RN, Post -op Vital signs reviewed and stable and Patient moving all extremities X 4  Post vital signs: Reviewed and stable  Last Vitals:  Filed Vitals:   02/25/16 1630 02/25/16 1640  BP:  127/76  Pulse:  93  Temp: 37 C   Resp:  13    Last Pain:  Filed Vitals:   02/25/16 1651  PainSc: Asleep      Patients Stated Pain Goal: 3 (02/25/16 1419)  Complications: No apparent anesthesia complications

## 2016-02-25 NOTE — Anesthesia Preprocedure Evaluation (Addendum)
Anesthesia Evaluation  Patient identified by MRN, date of birth, ID band Patient awake    Reviewed: Allergy & Precautions, NPO status , Patient's Chart, lab work & pertinent test results  Airway Mallampati: II  TM Distance: >3 FB Neck ROM: Full    Dental  (+) Dental Advisory Given   Pulmonary Current Smoker,    breath sounds clear to auscultation       Cardiovascular negative cardio ROS   Rhythm:Regular Rate:Normal     Neuro/Psych negative neurological ROS     GI/Hepatic negative GI ROS, Neg liver ROS,   Endo/Other  Morbid obesity  Renal/GU negative Renal ROS     Musculoskeletal   Abdominal   Peds  Hematology negative hematology ROS (+)   Anesthesia Other Findings   Reproductive/Obstetrics                            Lab Results  Component Value Date   WBC 4.8 09/02/2014   HGB 13.3 02/25/2016   HCT 38.1* 09/02/2014   MCV 83.9 09/02/2014   PLT 195 09/02/2014     Anesthesia Physical Anesthesia Plan  ASA: III  Anesthesia Plan: General   Post-op Pain Management:    Induction: Intravenous  Airway Management Planned: Oral ETT and LMA  Additional Equipment:   Intra-op Plan:   Post-operative Plan: Extubation in OR  Informed Consent: I have reviewed the patients History and Physical, chart, labs and discussed the procedure including the risks, benefits and alternatives for the proposed anesthesia with the patient or authorized representative who has indicated his/her understanding and acceptance.   Dental advisory given  Plan Discussed with: CRNA  Anesthesia Plan Comments:        Anesthesia Quick Evaluation

## 2016-02-26 ENCOUNTER — Encounter (HOSPITAL_COMMUNITY): Payer: Self-pay | Admitting: Orthopedic Surgery
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
## 2016-03-07 MED FILL — Hydrocodone-Acetaminophen Tab 5-325 MG: ORAL | Qty: 6 | Status: AC

## 2016-06-09 ENCOUNTER — Emergency Department (HOSPITAL_COMMUNITY)
Admission: EM | Admit: 2016-06-09 | Discharge: 2016-06-09 | Disposition: A | Discharge status: Home / Self Care | Payer: Self-pay | Attending: Emergency Medicine | Admitting: Emergency Medicine

## 2016-06-09 ENCOUNTER — Emergency Department (HOSPITAL_COMMUNITY): Payer: Self-pay

## 2016-06-09 ENCOUNTER — Encounter (HOSPITAL_COMMUNITY): Payer: Self-pay | Admitting: Emergency Medicine

## 2016-06-09 DIAGNOSIS — Z87891 Personal history of nicotine dependence: Secondary | ICD-10-CM | POA: Insufficient documentation

## 2016-06-09 DIAGNOSIS — L03116 Cellulitis of left lower limb: Secondary | ICD-10-CM | POA: Insufficient documentation

## 2016-06-09 DIAGNOSIS — R0789 Other chest pain: Secondary | ICD-10-CM | POA: Insufficient documentation

## 2016-06-09 DIAGNOSIS — R079 Chest pain, unspecified: Secondary | ICD-10-CM

## 2016-06-09 DIAGNOSIS — Z79899 Other long term (current) drug therapy: Secondary | ICD-10-CM | POA: Insufficient documentation

## 2016-06-09 DIAGNOSIS — R0602 Shortness of breath: Secondary | ICD-10-CM | POA: Insufficient documentation

## 2016-06-09 LAB — CBC
HCT: 40.6 % (ref 39.0–52.0)
HEMOGLOBIN: 13.5 g/dL (ref 13.0–17.0)
MCH: 27.6 pg (ref 26.0–34.0)
MCHC: 33.3 g/dL (ref 30.0–36.0)
MCV: 82.9 fL (ref 78.0–100.0)
PLATELETS: 214 10*3/uL (ref 150–400)
RBC: 4.9 MIL/uL (ref 4.22–5.81)
RDW: 13.2 % (ref 11.5–15.5)
WBC: 8 10*3/uL (ref 4.0–10.5)

## 2016-06-09 LAB — I-STAT TROPONIN, ED: TROPONIN I, POC: 0 ng/mL (ref 0.00–0.08)

## 2016-06-09 LAB — BASIC METABOLIC PANEL
Anion gap: 8 (ref 5–15)
BUN: 8 mg/dL (ref 6–20)
CO2: 25 mmol/L (ref 22–32)
CREATININE: 0.79 mg/dL (ref 0.61–1.24)
Calcium: 8.3 mg/dL — ABNORMAL LOW (ref 8.9–10.3)
Chloride: 105 mmol/L (ref 101–111)
GFR calc Af Amer: >60 mL/min (ref >60)
GLUCOSE: 95 mg/dL (ref 65–99)
Potassium: 3.6 mmol/L (ref 3.5–5.1)
SODIUM: 138 mmol/L (ref 135–145)

## 2016-06-09 MED ORDER — SULFAMETHOXAZOLE-TRIMETHOPRIM 800-160 MG PO TABS: 1.0000 | ORAL_TABLET | Freq: Two times a day (BID) | ORAL | 0 refills | Status: AC

## 2016-06-09 MED ORDER — SULFAMETHOXAZOLE-TRIMETHOPRIM 800-160 MG PO TABS
1.0000 | ORAL_TABLET | Freq: Once | ORAL | Status: AC
  Administered 2016-06-09: 1 via ORAL
  Filled 2016-06-09: qty 1

## 2016-06-09 NOTE — ED Triage Notes (Signed)
Patient complaining of mid sternal chest pain with shortness of breath since 0330 today. Also states he has redness noted to upper left leg since Monday with pain.

## 2016-06-09 NOTE — ED Provider Notes (Signed)
[facility]EMERGENCY DEPT Provider Note   CSN: [id number] Arrival date & time: 06/09/16  1928  First Provider Contact:  First MD Initiated Contact with Patient 06/09/16 1938     By signing my name below, I, [name], attest that this documentation has been prepared under the direction and in the presence of No att. providers found. Electronically Signed: [name], ED Scribe. 06/09/16. 8:07 PM.    History   Chief Complaint Chief Complaint  Patient presents with  . Chest Pain  . Leg Pain     The history is provided by the patient. No language interpreter was used.   HPI Comments:  Evan Patton is a 35 y.o. male, with a history of morbid obesity, who presents to the Emergency Department complaining of moderate, constant chest pain and left leg pain, which started three weeks ago. Symptoms lasted a couple of days and disappeared per pt, but returned with exerction yesterday. Associated symptoms per pt include wheezing, SOB, tightness in chest and SOB. Pain on leg is localized to left upper lateral leg and is erythematous,.Symptoms have gradually worsened since initial onset. 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 Family history of blood clots and MI. Denies history of asthma, fevers, tobacco use within the past year.   Past Medical History:  Diagnosis Date  . Morbid obesity (HCC)     There are no active problems to display for this patient.   Past Surgical History:  Procedure Laterality Date  . FOREIGN BODY REMOVAL Left 02/25/2016   Procedure: REMOVAL FOREIGN BODY LEFT HAND;  Surgeon: Betha Loa, MD;  Location: [facility] OR;  Service: Orthopedics;  Laterality: Left;  ANESTHESIA:  BIER BLOCK  . TONSILLECTOMY    . tumor removal from  rt leg     bone spur removed       Home Medications    Prior to Admission medications   Medication Sig  Start Date End Date Taking? Authorizing Provider  sulfamethoxazole-trimethoprim (BACTRIM DS,SEPTRA DS) 800-160 MG tablet Take 1 tablet by mouth 2 (two) times daily. 06/09/16 06/19/16  Eber Hong, MD    Family History Family History  Problem Relation Age of Onset  . Diabetes Mother   . Thyroid disease Mother   . Hypertension Mother   . Congestive Heart Failure Mother   . Arthritis/Rheumatoid Father     Social History Social History  Substance Use Topics  . Smoking status: Former Smoker    Types: Cigarettes  . Smokeless tobacco: Never Used  . Alcohol use No     Allergies   Review of patient's allergies indicates no known allergies.   Review of Systems Review of Systems  Constitutional: Negative for fever.  Respiratory: Positive for chest tightness and shortness of breath.   Cardiovascular: Positive for chest pain.       Pain described as chest tightness.  Skin: Positive for rash.       Redness and raised rash on left leg.  All other systems reviewed and are negative.    Physical Exam Updated Vital Signs BP 121/80   Pulse 71   Temp 98.4 F (36.9 C) (Oral)   Resp 16   Ht  (1.88 m)   Wt (!) 390 lb (176.9 kg)   SpO2 98%   BMI 50.07 kg/m   Physical Exam  Constitutional: He appears well-developed  and well-nourished. No distress.  Obese.  HENT:  Head: Normocephalic and atraumatic.  Mouth/Throat: Oropharynx is clear and moist. No oropharyngeal exudate.  Eyes: Conjunctivae and EOM are normal. Pupils are equal, round, and reactive to light. Right eye exhibits no discharge. Left eye exhibits no discharge. No scleral icterus.  Neck: Normal range of motion. Neck supple. No JVD present. No thyromegaly present.  Cardiovascular: Normal rate, regular rhythm, normal heart sounds and intact distal pulses.  Exam reveals no gallop and no friction rub.   No murmur heard. Pulmonary/Chest: Effort normal and breath sounds normal. No respiratory distress. He has no wheezes. He  has no rales.  Abdominal: Soft. Bowel sounds are normal. He exhibits no distension and no mass. There is no tenderness.  Musculoskeletal: Normal range of motion. He exhibits no edema or tenderness.  Lymphadenopathy:    He has no cervical adenopathy.  Neurological: He is alert. Coordination normal.  Skin: Skin is warm and dry. Rash noted. There is erythema.  3 cm x 8 cm on left lateral thigh, red, indurated and warm. No fluctuant  Psychiatric: He has a normal mood and affect. His behavior is normal.  Nursing note and vitals reviewed.    ED Treatments / Results  DIAGNOSTIC STUDIES:  Oxygen Saturation is  96% on room air, normal by my interpretation.    COORDINATION OF CARE:  8:06 PM Discussed treatment plan with pt at bedside and pt agreed to plan.  Labs (all labs ordered are listed, but only abnormal results are displayed) Labs Reviewed  BASIC METABOLIC PANEL - Abnormal; Notable for the following:       Result Value   Calcium 8.3 (*)    All other components within normal limits  CBC  I-STAT TROPOININ, ED    EKG  EKG Interpretation  Date/Time:  Thursday June 09 2016 19:38:09 EDT Ventricular Rate:  82 PR Interval:    QRS Duration: 121 QT Interval:  403 QTC Calculation: 471 R Axis:   8 Text Interpretation:  Sinus rhythm Nonspecific intraventricular conduction delay ECG OTHERWISE WITHIN NORMAL LIMITS No old tracing to compare Confirmed by Atia Haupt  MD, Brently Voorhis ([id number]) on 06/10/2016 1:03:22 PM       Radiology Dg Chest 2 View  Result Date: 06/09/2016 CLINICAL DATA:  Blurry vision and dizziness for 3 weeks. EXAM: CHEST  2 VIEW COMPARISON:  Single-view of the chest 05/12/2016. PA and lateral chest 03/10/2015. FINDINGS: The lungs are clear. Heart size is normal. No pneumothorax or pleural effusion. No focal bony abnormality. IMPRESSION: Negative chest. Electronically Signed   By: Drusilla Kanner M.D.   On: 06/09/2016 20:13    Procedures Procedures (including critical care  time)  Medications Ordered in ED Medications  sulfamethoxazole-trimethoprim (BACTRIM DS,SEPTRA DS) 800-160 MG per tablet 1 tablet (1 tablet Oral Given 06/09/16 2140)     Initial Impression / Assessment and Plan / ED Course  I have reviewed the triage vital signs and the nursing notes.  Pertinent labs & imaging results that were available during my care of the patient were reviewed by me and considered in my medical decision making (see chart for details).  Clinical Course  Comment By Time  Labs unremarkable - ECG unremarkable - well appaering, pain for > 12 hours straight without any relief.  Unlikely to be PE or ACS - pulse of 61 at last check - will have come back in the AM for US of the legs. Eber Hong, MD 08/10 2126    Pt agreeable  Final Clinical Impressions(s) / ED Diagnoses   Final diagnoses:  Chest pain, unspecified chest pain type  Cellulitis of left lower extremity    New Prescriptions Discharge Medication List as of 06/09/2016  9:41 PM    START taking these medications   Details  sulfamethoxazole-trimethoprim (BACTRIM DS,SEPTRA DS) 800-160 MG tablet Take 1 tablet by mouth 2 (two) times daily., Starting Thu 06/09/2016, Until Sun 06/19/2016, Print        I personally performed the services described in this documentation, which was scribed in my presence. The recorded information has been reviewed and is accurate.        Eber HongBrian Damarie Schoolfield, MD 06/10/16 1304

## 2016-06-09 NOTE — Discharge Instructions (Signed)
Return in the morning for your ultrasound Aspirin daily until you see the cardiologist Call for appointment with the heart doctor in the morning Bactrim twice daily for 10 days for cellulitis  Please obtain all of your results from medical records or have your doctors office obtain the results - share them with your doctor - you should be seen at your doctors office in the next 2 days. Call today to arrange your follow up. Take the medications as prescribed. Please review all of the medicines and only take them if you do not have an allergy to them. Please be aware that if you are taking birth control pills, taking other prescriptions, ESPECIALLY ANTIBIOTICS may make the birth control ineffective - if this is the case, either do not engage in sexual activity or use alternative methods of birth control such as condoms until you have finished the medicine and your family doctor says it is OK to restart them. If you are on a blood thinner such as COUMADIN, be aware that any other medicine that you take may cause the coumadin to either work too much, or not enough - you should have your coumadin level rechecked in next 7 days if this is the case.  ?  It is also a possibility that you have an allergic reaction to any of the medicines that you have been prescribed - Everybody reacts differently to medications and while MOST people have no trouble with most medicines, you may have a reaction such as nausea, vomiting, rash, swelling, shortness of breath. If this is the case, please stop taking the medicine immediately and contact your physician.  ?  You should return to the ER if you develop severe or worsening symptoms.

## 2016-06-09 NOTE — ED Notes (Signed)
Pt c/o mid sternal chest pain that is non radiating with sob that started around 03:30 this am, sob increases with exertion which pt states is new for him, was seen at [facility] 3 weeks ago for same with negative workup,

## 2016-06-10 ENCOUNTER — Inpatient Hospital Stay (HOSPITAL_COMMUNITY): Admit: 2016-06-10 | Payer: Self-pay

## 2018-05-11 ENCOUNTER — Other Ambulatory Visit: Payer: Self-pay

## 2018-05-11 ENCOUNTER — Encounter (HOSPITAL_COMMUNITY): Payer: Self-pay | Admitting: *Deleted

## 2018-05-11 ENCOUNTER — Emergency Department (HOSPITAL_COMMUNITY)
Admission: EM | Admit: 2018-05-11 | Discharge: 2018-05-12 | Disposition: A | Discharge status: Home / Self Care | Payer: BLUE CROSS/BLUE SHIELD | Attending: Emergency Medicine | Admitting: Emergency Medicine

## 2018-05-11 DIAGNOSIS — M79605 Pain in left leg: Secondary | ICD-10-CM | POA: Diagnosis not present

## 2018-05-11 DIAGNOSIS — Z87891 Personal history of nicotine dependence: Secondary | ICD-10-CM | POA: Diagnosis not present

## 2018-05-11 NOTE — ED Triage Notes (Signed)
Pt c/o left leg pain that has gotten progressively worse over the last week; pt states he has a bruise to left hip area and the pain radiates all the way down his leg

## 2018-05-12 ENCOUNTER — Ambulatory Visit (HOSPITAL_COMMUNITY)
Admission: RE | Admit: 2018-05-12 | Discharge: 2018-05-12 | Disposition: A | Discharge status: Home / Self Care | Payer: BLUE CROSS/BLUE SHIELD | Source: Ambulatory Visit | Attending: Emergency Medicine | Admitting: Emergency Medicine

## 2018-05-12 ENCOUNTER — Inpatient Hospital Stay (HOSPITAL_COMMUNITY): Admit: 2018-05-12 | Payer: Self-pay

## 2018-05-12 ENCOUNTER — Other Ambulatory Visit (HOSPITAL_COMMUNITY): Payer: Self-pay | Admitting: Emergency Medicine

## 2018-05-12 DIAGNOSIS — M7989 Other specified soft tissue disorders: Secondary | ICD-10-CM

## 2018-05-12 MED ORDER — RIVAROXABAN 15 MG PO TABS
ORAL_TABLET | ORAL | Status: AC
  Filled 2018-05-12: qty 1

## 2018-05-12 MED ORDER — HYDROCODONE-ACETAMINOPHEN 5-325 MG PO TABS: ORAL_TABLET | ORAL | 0 refills | Status: DC

## 2018-05-12 MED ORDER — RIVAROXABAN 15 MG PO TABS
15.0000 mg | ORAL_TABLET | Freq: Once | ORAL | Status: AC
  Administered 2018-05-12: 15 mg via ORAL
  Filled 2018-05-12: qty 1

## 2018-05-12 NOTE — ED Provider Notes (Signed)
[facility] Provider Note   CSN: [id number] Arrival date & time: 05/11/18  2308     History   Chief Complaint Chief Complaint  Patient presents with  . Leg Pain    HPI Evan Patton is a 37 y.o. male.  HPI  Evan Patton is a 37 y.o. male who presents to the Emergency Department complaining of left anterior leg pain for one week.  He describes the pain as gradually worsening and associated with standing or walking.  This evening his spouse noticed a bruise to his left groin area and two small areas of redness to the mid thigh.  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  He also states the he had an uncle and grandfather that both died from blood clots.  He denies known injury, fever, chills, back pain, numbness or weakness of the extremity.  No chest pain or shortness of breath.      Past Medical History:  Diagnosis Date  . Morbid obesity (HCC)     There are no active problems to display for this patient.   Past Surgical History:  Procedure Laterality Date  . FOREIGN BODY REMOVAL Left 02/25/2016   Procedure: REMOVAL FOREIGN BODY LEFT HAND;  Surgeon: Betha LoaKevin Kuzma, MD;  Location: [facility] OR;  Service: Orthopedics;  Laterality: Left;  ANESTHESIA:  BIER BLOCK  . TONSILLECTOMY    . tumor removal from  rt leg     bone spur removed      Home Medications    Prior to Admission medications   Not on File    Family History Family History  Problem Relation Age of Onset  . Diabetes Mother   . Thyroid disease Mother   . Hypertension Mother   . Congestive Heart Failure Mother   . Arthritis/Rheumatoid Father     Social History Social History   Tobacco Use  . Smoking status: Former Smoker    Types: Cigarettes  . Smokeless tobacco: Never Used  Substance Use Topics  . Alcohol use: No  . Drug use: No     Allergies   Patient has no known allergies.   Review of Systems Review of Systems    Constitutional: Negative for chills and fever.  Respiratory: Negative for chest tightness and shortness of breath.   Cardiovascular: Negative for chest pain.  Gastrointestinal: Negative for abdominal pain, nausea and vomiting.  Genitourinary: Negative for difficulty urinating and dysuria.  Musculoskeletal: Positive for myalgias (left thigh). Negative for arthralgias and joint swelling.  Skin: Negative for rash and wound.       Two red "spots" to the left thigh  Neurological: Negative for weakness, light-headedness and numbness.  All other systems reviewed and are negative.    Physical Exam Updated Vital Signs BP (!) 133/53 (BP Location: Left Arm)   Pulse 96   Temp 98.2 F (36.8 C) (Oral)   Resp 19   Ht 6\' 2"  (1.88 m)   Wt (!) 174.6 kg (385 lb)   SpO2 98%   BMI 49.43 kg/m   Physical Exam  Constitutional: He is oriented to person, place, and time. He appears well-developed and well-nourished. No distress.  Pt is obese  HENT:  Head: Atraumatic.  Neck: Normal range of motion. Neck supple. No JVD present.  Cardiovascular: Normal rate, regular rhythm, normal heart sounds and intact distal pulses.  No murmur heard. Strong, symmetrical DP pulses bilaterally  Pulmonary/Chest: Effort normal  and breath sounds normal. No respiratory distress. He exhibits no tenderness.  Abdominal: Soft. He exhibits no distension. There is no tenderness. There is no guarding.  Musculoskeletal: He exhibits tenderness.  Pt has multiple varicosities of bilateral legs with focal ttp of the anterior left thigh, two small areas of mild erythema,  No posterior extremity tenderness.  unable to determine if extremity is edematous due to pt's body habitus.  No confluent erythema or excessive warmth of the extremity  Neurological: He is alert and oriented to person, place, and time. No sensory deficit.  Skin: Skin is warm. Capillary refill takes less than 2 seconds.  3 cm area of ecchymosis to the left mid groin.   No hemtoma  Psychiatric: He has a normal mood and affect.  Nursing note and vitals reviewed.    ED Treatments / Results  Labs (all labs ordered are listed, but only abnormal results are displayed) Labs Reviewed - No data to display  EKG None  Radiology No results found.  Procedures Procedures (including critical care time)  Medications Ordered in ED Medications - No data to display   Initial Impression / Assessment and Plan / ED Course  I have reviewed the triage vital signs and the nursing notes.  Pertinent labs & imaging results that were available during my care of the patient were reviewed by me and considered in my medical decision making (see chart for details).     Pt with left leg pain and multiple varicosities. NV intact.  Sx's for one week.  No CP or dyspnea, well appearing.  Sx's are felt to likely be superficial, but given risk factors I will give Xarelto tonight and pt is scheduled for out patient venous US for Saturday morning at 9:00 am.    Final Clinical Impressions(s) / ED Diagnoses   Final diagnoses:  Left leg pain    ED Discharge Orders    None       Pauline Aus, PA-C 05/12/18 0128    Zadie Rhine, MD 05/12/18 [phone]

## 2018-05-12 NOTE — Discharge Instructions (Addendum)
Warm soaks or compresses on/off to your leg.  You are scheduled to return here in the morning to have an ultrasound of your left leg.  Your appt is scheduled for 9:00 am, but please arrive 15 minutes early to register.  You will be notified of your results before you leave.  Follow-up with your primary doctor next week for recheck

## 2018-05-12 NOTE — ED Provider Notes (Signed)
Left lower extremity Doppler negative for DVT.  Discussed with patient.   Donnetta Hutchingook, Leonid Manus, MD 05/12/18 1110

## 2018-05-14 MED FILL — Hydrocodone-Acetaminophen Tab 5-325 MG: ORAL | Qty: 6 | Status: AC

## 2020-06-16 IMAGING — US US EXTREM LOW VENOUS*L*
1 series · 13 of 24 positions shown · non-contrast
Comparison: None.

CLINICAL DATA: Left lower extremity pain and edema.



[Series 1: us extrem low venous*left* · 0.09mm/px · 13 of 52 slices shown]
[im 1/52]
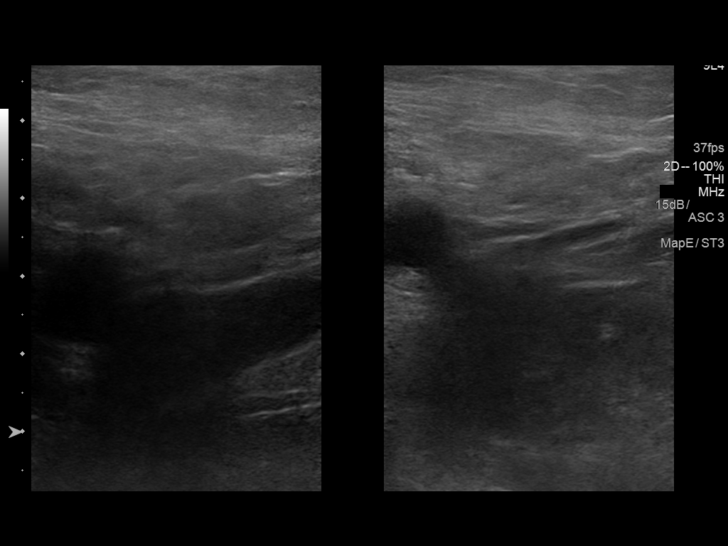
[im 5/52]
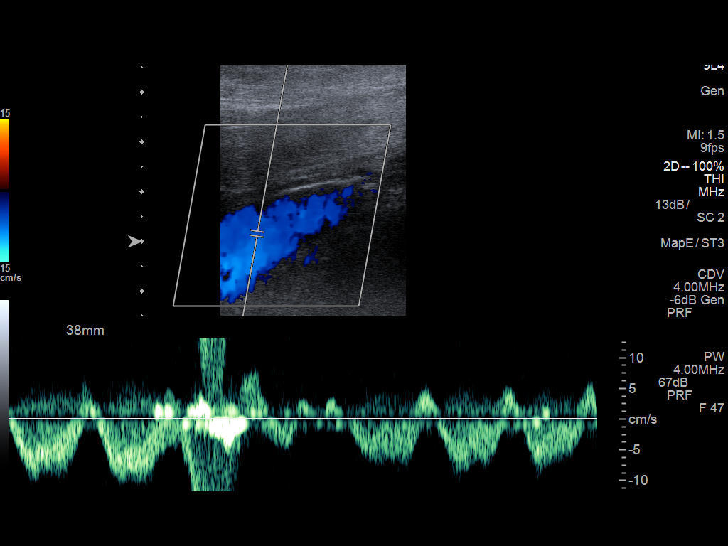
[im 9/52]
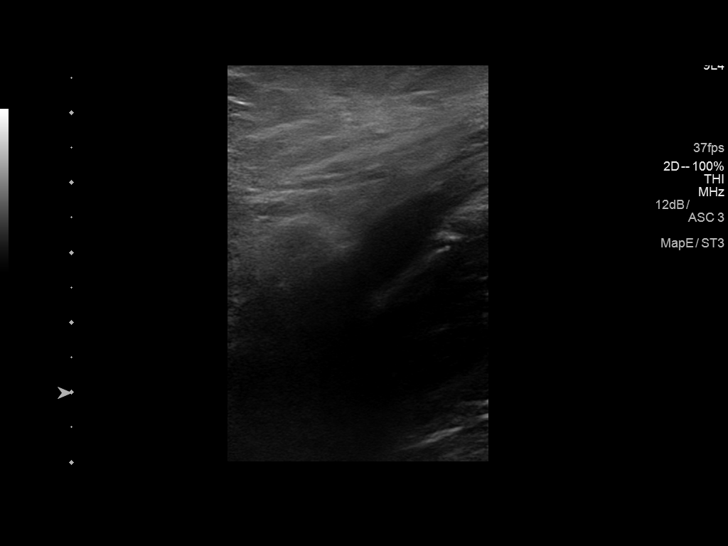
[im 14/52]
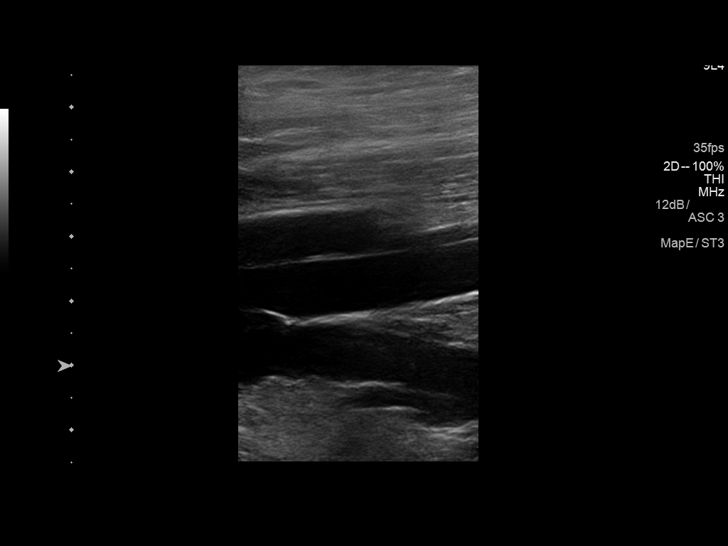
[im 18/52]
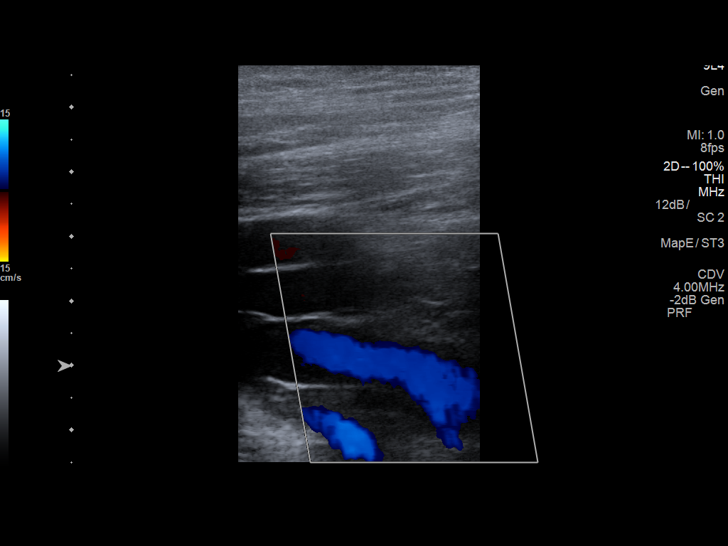
[im 23/52]
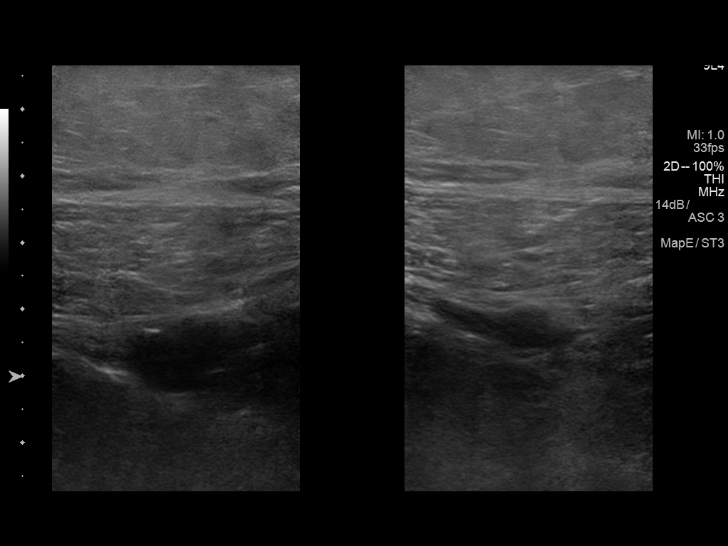
[im 27/52]
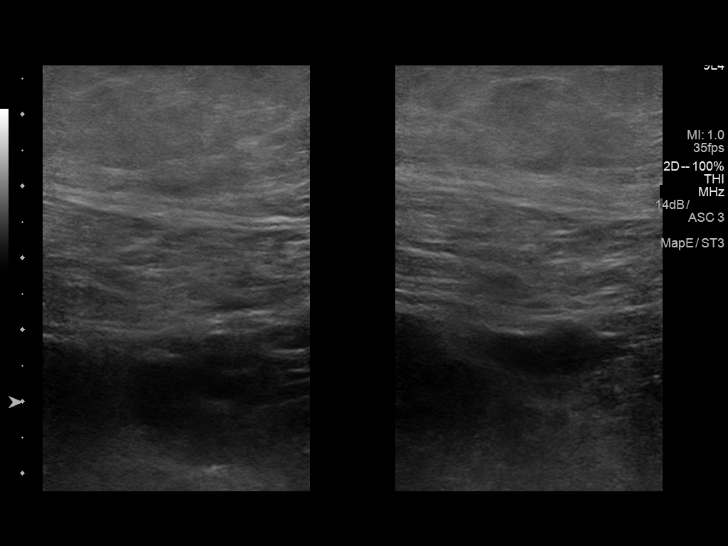
[im 29/52]
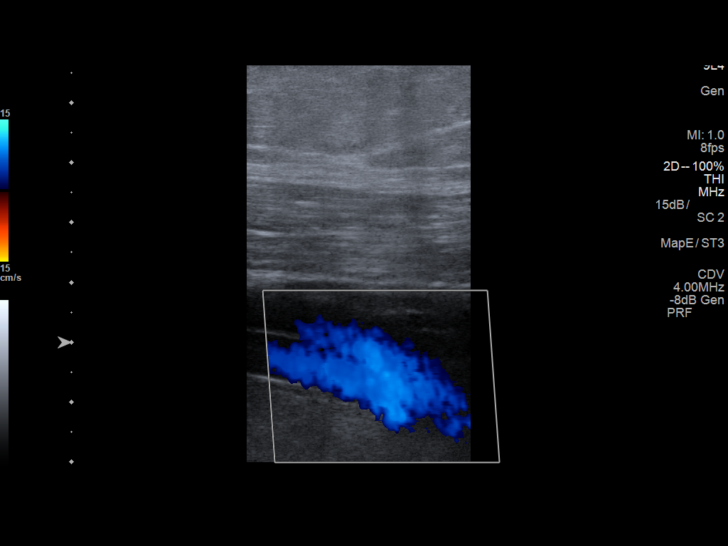
[im 34/52]
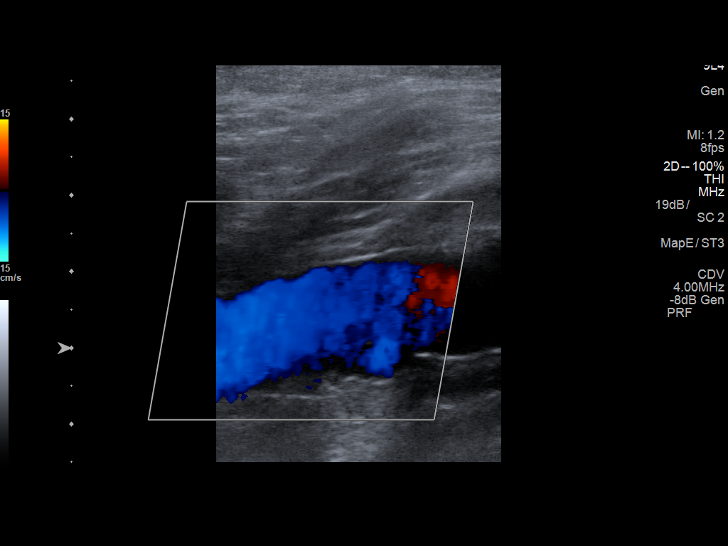
[im 38/52]
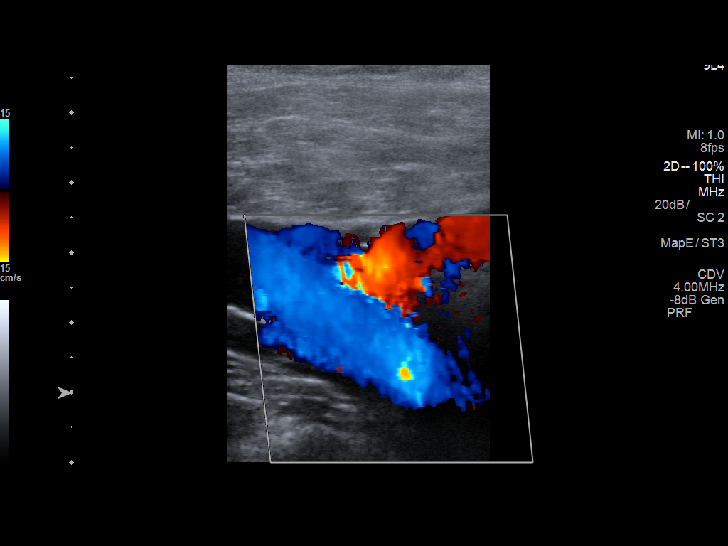
[im 43/52]
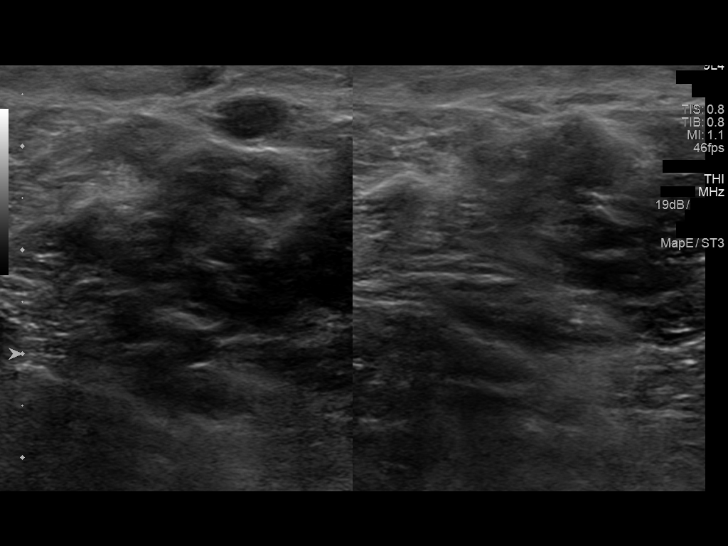
[im 47/52]
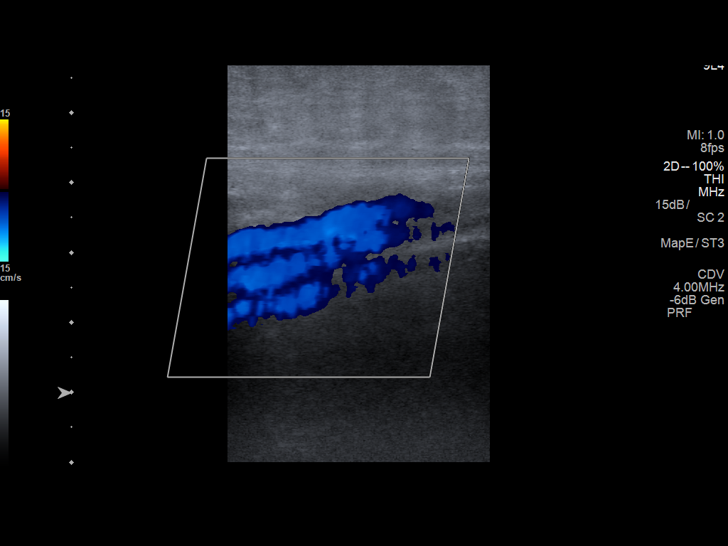
[im 52/52]
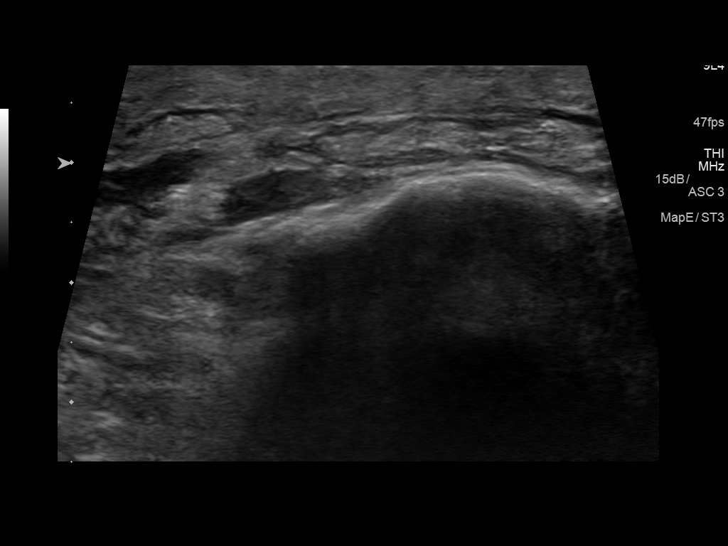

[13 of 24 positions shown; findings below may reference images not displayed]

FINDINGS: Contralateral Common Femoral Vein: Respiratory phasicity is normal
and symmetric with the symptomatic side. No evidence of thrombus.
Normal compressibility.

Common Femoral Vein: No evidence of thrombus. Normal
compressibility, respiratory phasicity and response to augmentation.

Saphenofemoral Junction: No evidence of thrombus. Normal
compressibility and flow on color Doppler imaging.

Profunda Femoral Vein: No evidence of thrombus. Normal
compressibility and flow on color Doppler imaging.

Femoral Vein: No evidence of thrombus. Normal compressibility,
respiratory phasicity and response to augmentation.

Popliteal Vein: No evidence of thrombus. Normal compressibility,
respiratory phasicity and response to augmentation.

Calf Veins: No evidence of thrombus. Normal compressibility and flow
on color Doppler imaging.

Superficial Great Saphenous Vein: No evidence of thrombus. Normal
compressibility.

Venous Reflux:  None.

Other Findings: There are patent varicosities of the left calf that
appear to communicate with the GSV.
IMPRESSION: No evidence of left lower extremity deep venous thrombosis. Left
calf varicose veins communicate with the GSV.

## 2024-06-05 NOTE — Progress Notes (Signed)
 Subjective:    Evan Patton is a 43 y.o. (DOB [birth year]) male.     Patient presents with  . Varicose Veins     HPI  02-28-2024:  Very pleasant 43 year old gentleman with history of morbid obesity who comes today for evaluation of bilateral leg swelling, left greater than right. He also has pain in his left leg. He was seen in the emergency room yesterday in [place]. He had a ultrasound of his left leg that I have reviewed. It showed no DVT. He has a history of an echocardiogram in June 2024 that was normal. He comes from a family of people who are built large and have had leg swelling and varicose veins. He denies any history of DVT. 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 At the end of every day he has significant pain and swelling in both legs, left greater than right. He has worsening varicose veins in both legs that are painful. He has worn over-the-counter compression therapy.   06-05-2024: Evan Patton returns in follow-up today for bilateral lower extremity swelling and varicose veins.  He is accompanied by his wife.  Since I saw him last he has been wearing compression hose religiously.  He says that it definitely helps with his swelling and discomfort in the lower legs at the end of the day.  He still has varicosities in his thighs that sometimes make it feel like he has something crawling inside of them.  The discomfort is tolerable.  He had venous reflux studies which I have reviewed.  He had no significant reflux in the deep veins in either leg.  In his right leg he had great saphenous vein reflux at the mid calf only.  In the left leg he had focal reflux in the great saphenous vein in the proximal calf only.  He had varicosities arising from the lateral pelvis and both legs.  He also had varicosities arising from his small saphenous vein in the left leg that was incompetent.  He remains very interested in weight  loss surgery.  Review of Systems   CONSTITUTIONAL:  Fever, chills, or night sweats? no GENERAL:  Fatigue?  no Unexpected weight loss or weight gain? no CARDIAC:  Chest pain during exertion or walking? no PULMONARY:  Shortness of breath during exertion? no EXTREMITIES:  Leg pain with walking? no Wounds on the foot not healing? no Pain in the feet that awakens you from sleep (rest pain)? no NEUROLOGIC:  Weakness or numbness on one side of the body? no MISC:  Other organ systems reviewed, and are reported negative, except as noted in HPI.  Objective:   Physical Exam Awake and alert NAD Normal respiratory pattern Neuro grossly intact  Bilateral lower extremity varicose veins emanating from the lateral hip and both legs, see old pictures below Edema in both lower legs  Pictures from 02-28-2024 (appearance unchanged)         Assessment / Plan:  Venous insufficiency with bilateral lower extremity varicose veins: Mr. Adra continues to have significant varicosities and swelling in both legs.  He has no evidence of deep venous reflux.  He has focal distal great saphenous vein reflux that I think does not contribute much to his issues.  He does have some small saphenous vein reflux that gives off some varicosities.  The majority of his varicosities come from his lateral pelvis.  He has been wearing compression hose and  this helps with his swelling and pain.  While we could consider phlebectomy in both of his lower extremities and a small saphenous vein ablation in the left leg, he and I agree that weight loss would be much more beneficial for him.  I certainly cannot guarantee that he will have significant improvement in his swelling with superficial venous treatment at this time.  We would only be able to guarantee improvement in the symptoms related to the varicosities themselves, which are minimal.  For now he will continue with conservative treatment.  I have made a referral to Dr. Sidra Pine for evaluation for weight loss surgery.  I will be available to Mr. Outland in the future for venous treatment after weight loss.   Risks, benefits, and alternatives of the medications and treatment plan prescribed today were discussed, and patient expressed understanding. Plan follow-up as discussed or as needed if any worsening symptoms or change in condition.
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
## 2024-08-19 ENCOUNTER — Emergency Department (HOSPITAL_BASED_OUTPATIENT_CLINIC_OR_DEPARTMENT_OTHER)

## 2024-08-19 ENCOUNTER — Emergency Department (HOSPITAL_BASED_OUTPATIENT_CLINIC_OR_DEPARTMENT_OTHER)
Admission: EM | Admit: 2024-08-19 | Discharge: 2024-08-19 | Disposition: A | Discharge status: Home / Self Care | Attending: Emergency Medicine | Admitting: Emergency Medicine

## 2024-08-19 ENCOUNTER — Other Ambulatory Visit: Payer: Self-pay

## 2024-08-19 ENCOUNTER — Encounter (HOSPITAL_BASED_OUTPATIENT_CLINIC_OR_DEPARTMENT_OTHER): Payer: Self-pay | Admitting: Emergency Medicine

## 2024-08-19 DIAGNOSIS — M25561 Pain in right knee: Secondary | ICD-10-CM | POA: Diagnosis present

## 2024-08-19 MED ORDER — HYDROCODONE-ACETAMINOPHEN 5-325 MG PO TABS: 2.0000 | ORAL_TABLET | ORAL | 0 refills | Status: AC | PRN

## 2024-08-19 NOTE — Discharge Instructions (Signed)
 X-ray and exam are reassuring for no evidence of acute fracture.  It is recommended to  follow-up with orthopedics for further assessment and management.  It is advised to take Tylenol  and ibuprofen as needed I prescribed you a short course of pain management please only use if Tylenol  and ibuprofen are not helpful.  Please use ice and heat to help with pain as well.  If you have concerning symptoms or worsening symptoms please return to ED for further evaluation.
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
## 2024-09-24 ENCOUNTER — Ambulatory Visit
Admission: RE | Admit: 2024-09-24 | Discharge: 2024-09-24 | Disposition: A | Discharge status: Home / Self Care | Source: Ambulatory Visit

## 2024-09-24 ENCOUNTER — Ambulatory Visit

## 2024-09-24 VITALS — BP 153/85 | HR 89 | Temp 97.9°F | Resp 20

## 2024-09-24 DIAGNOSIS — S81832A Puncture wound without foreign body, left lower leg, initial encounter: Secondary | ICD-10-CM | POA: Diagnosis not present

## 2024-09-24 DIAGNOSIS — Z23 Encounter for immunization: Secondary | ICD-10-CM | POA: Diagnosis not present

## 2024-09-24 MED ORDER — TETANUS-DIPHTH-ACELL PERTUSSIS 5-2-15.5 LF-MCG/0.5 IM SUSP
0.5000 mL | Freq: Once | INTRAMUSCULAR | Status: AC
  Administered 2024-09-24: 0.5 mL via INTRAMUSCULAR

## 2024-09-24 MED ORDER — CEPHALEXIN 500 MG PO CAPS: 500.0000 mg | ORAL_CAPSULE | Freq: Four times a day (QID) | ORAL | 0 refills | Status: AC

## 2024-09-24 NOTE — ED Provider Notes (Signed)
 [facility]    CSN: [id number] Arrival date & time: 09/24/24  1810      History   Chief Complaint Chief Complaint  Patient presents with   Leg Injury    A rusty piece of metal created a 3 inch puncture wound in my left leg, and my last tetanus shot was in 2016 - Entered by patient    HPI Evan Patton is a 43 y.o. male.   Patient presents with puncture wound to left lower leg that occurred about 2 hours prior to arrival to urgent care. He states that he was working on a lawnmower when he accidentally hit his leg on a rusty nail that was laying on the ground. He reports that it went about 3 inches into his leg. He pulled it out completely intact. Denies any numbness or tingling. His last tetanus vaccine was in 2017.      Past Medical History:  Diagnosis Date   Morbid obesity (HCC)     There are no active problems to display for this patient.   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 Authorizing Provider  cephALEXin  (KEFLEX ) 500 MG capsule Take 1 capsule (500 mg total) by mouth 4 (four) times daily for 7 days. 09/24/24 10/01/24 Yes Saachi Zale, Darryle BRAVO, FNP  HYDROcodone -acetaminophen  (NORCO/VICODIN) 5-325 MG tablet Take 2 tablets by mouth every 4 (four) hours as needed. Patient not taking: Reported on 09/24/2024 08/19/24   Myriam Fonda RAMAN, PA-C  testosterone cypionate (DEPOTESTOSTERONE CYPIONATE) 200 MG/ML injection SMARTSIG:Milliliter(s) IM    [provider]    Family History Family History  Problem Relation Age of Onset   Diabetes Mother    Thyroid disease Mother     Hypertension Mother    Congestive Heart Failure Mother    Arthritis/Rheumatoid Father     Social History Social History   Tobacco Use   Smoking status: Former    Types: Cigarettes   Smokeless tobacco: Never  Vaping Use   Vaping status: Never Used  Substance Use Topics   Alcohol use: No   Drug use: No     Allergies   Patient has no known allergies.   Review of Systems Review of Systems Per HPI  Physical Exam Triage Vital Signs ED Triage Vitals  Encounter Vitals Group     BP 09/24/24 1823 (!) 153/85     Girls Systolic BP Percentile --      Girls Diastolic BP Percentile --      Boys Systolic BP Percentile --      Boys Diastolic BP Percentile --      Pulse Rate 09/24/24 1823 89     Resp 09/24/24 1823 20     Temp 09/24/24 1823 97.9 F (36.6 C)     Temp Source 09/24/24 1823 Oral     SpO2 09/24/24 1823 96 %     Weight --      Height --      Head Circumference --      Peak Flow --  Pain Score 09/24/24 1821 0     Pain Loc --      Pain Education --      Exclude from Growth Chart --    No data found.  Updated Vital Signs BP (!) 153/85 (BP Location: Right Arm)   Pulse 89   Temp 97.9 F (36.6 C) (Oral)   Resp 20   SpO2 96%   Visual Acuity Right Eye Distance:   Left Eye Distance:   Bilateral Distance:    Right Eye Near:   Left Eye Near:    Bilateral Near:     Physical Exam Constitutional:      General: He is not in acute distress.    Appearance: Normal appearance. He is not toxic-appearing or diaphoretic.  HENT:     Head: Normocephalic and atraumatic.  Eyes:     Extraocular Movements: Extraocular movements intact.     Conjunctiva/sclera: Conjunctivae normal.  Pulmonary:     Effort: Pulmonary effort is normal.  Skin:    Comments: Pinpoint puncture wound noted to lateral mid left lower leg. No bleeding noted. No surrounding swelling or erythema. Neurovascularly intact. Patient is able to ambulate.   Neurological:     General: No focal deficit  present.     Mental Status: He is alert and oriented to person, place, and time. Mental status is at baseline.  Psychiatric:        Mood and Affect: Mood normal.        Behavior: Behavior normal.        Thought Content: Thought content normal.        Judgment: Judgment normal.      UC Treatments / Results  Labs (all labs ordered are listed, but only abnormal results are displayed) Labs Reviewed - No data to display  EKG   Radiology DG Tibia/Fibula Left Result Date: 09/24/2024 EXAM: VIEW(S) XRAY OF THE LEFT TIBIA AND FIBULA 09/24/2024 06:42:57 PM COMPARISON: None available. CLINICAL HISTORY: leg pain FINDINGS: BONES AND JOINTS: No acute fracture. No focal osseous lesion. No joint dislocation. SOFT TISSUES: There is a small density noted in the anterior soft tissues in the area marked as area of injury. While this could reflect a small radiopaque foreign body, it does not appear metallic and may reflect a soft tissue calcification. IMPRESSION: 1. Small nonmetallic radiopaque density in the anterior soft tissues at the reported site of injury, which may represent a small foreign body or soft tissue calcification. Electronically signed by: Franky Crease MD 09/24/2024 07:09 PM EST RP Workstation: [id number]    Procedures Procedures (including critical care time)  Medications Ordered in UC Medications  Tdap (ADACEL) injection 0.5 mL (0.5 mLs Intramuscular Given 09/24/24 1842)    Initial Impression / Assessment and Plan / UC Course  I have reviewed the triage vital signs and the nursing notes.  Pertinent labs & imaging results that were available during my care of the patient were reviewed by me and considered in my medical decision making (see chart for details).     X-ray completed given patient reported depth of wound. It showed concern for remaining foreign body versus calcification. Will place patient on cephalexin  and have him follow up with orthopedist to confirm and for further  evaluation. Crcl is normal so no dosage adjustment necessary. Patient educated on wound care and to monitor for signs of infection. Wound cleaned and dressing applied prior to discharge. Tetanus vaccine updated today. Orthopedist follow up information provided for patient. Patient verbalized understanding and  was agreeable with plan.  Final Clinical Impressions(s) / UC Diagnoses   Final diagnoses:  Puncture wound of left lower leg, initial encounter     Discharge Instructions      Your x-ray is showing a possible foreign body remaining but this is also not confirmed. I have prescribed an antibiotic to take. Tetanus vaccine updated today. Follow up with orthopedist.      ED Prescriptions     Medication Sig Dispense Auth. Provider   cephALEXin  (KEFLEX ) 500 MG capsule Take 1 capsule (500 mg total) by mouth 4 (four) times daily for 7 days. 28 capsule Sophia, Izabela Ow E, [place]      PDMP not reviewed this encounter.   Hazen Darryle BRAVO, [place] 09/24/24 [phone]

## 2024-09-24 NOTE — ED Triage Notes (Signed)
 Pt was working in the yard and a piece of metal punctured his left leg. No home intervention

## 2024-09-24 NOTE — Discharge Instructions (Signed)
 Your x-ray is showing a possible foreign body remaining but this is also not confirmed. I have prescribed an antibiotic to take. Tetanus vaccine updated today. Follow up with orthopedist.
